# Patient Record
Sex: Male | Born: 1995 | Race: White | Marital: Single | Smoking: Former smoker
Health system: Southern US, Community
[De-identification: ages and names within clinical notes are randomized; demographics above are authoritative.]

## PROBLEM LIST (undated history)

## (undated) DIAGNOSIS — M1990 Unspecified osteoarthritis, unspecified site: Secondary | ICD-10-CM

## (undated) DIAGNOSIS — F1090 Alcohol use, unspecified, uncomplicated: Secondary | ICD-10-CM

## (undated) DIAGNOSIS — J45909 Unspecified asthma, uncomplicated: Secondary | ICD-10-CM

## (undated) DIAGNOSIS — Z7289 Other problems related to lifestyle: Secondary | ICD-10-CM

## (undated) DIAGNOSIS — K219 Gastro-esophageal reflux disease without esophagitis: Secondary | ICD-10-CM

## (undated) HISTORY — DX: Alcohol use, unspecified, uncomplicated: F10.90

## (undated) HISTORY — DX: Other problems related to lifestyle: Z72.89

## (undated) HISTORY — DX: Gastro-esophageal reflux disease without esophagitis: K21.9

---
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
## 2014-01-30 ENCOUNTER — Ambulatory Visit: Payer: Self-pay | Admitting: Physician Assistant

## 2014-01-30 LAB — GC/CHLAMYDIA PROBE AMP

## 2014-10-26 ENCOUNTER — Emergency Department: Payer: 59

## 2014-10-26 ENCOUNTER — Encounter: Payer: Self-pay | Admitting: Emergency Medicine

## 2014-10-26 ENCOUNTER — Emergency Department
Admission: EM | Admit: 2014-10-26 | Discharge: 2014-10-26 | Disposition: A | Discharge status: Home / Self Care | Payer: 59 | Attending: Emergency Medicine | Admitting: Emergency Medicine

## 2014-10-26 DIAGNOSIS — R112 Nausea with vomiting, unspecified: Secondary | ICD-10-CM | POA: Insufficient documentation

## 2014-10-26 DIAGNOSIS — R52 Pain, unspecified: Secondary | ICD-10-CM

## 2014-10-26 DIAGNOSIS — R1013 Epigastric pain: Secondary | ICD-10-CM | POA: Diagnosis not present

## 2014-10-26 LAB — COMPREHENSIVE METABOLIC PANEL
ALBUMIN: 5.2 g/dL — AB (ref 3.5–5.0)
ALT: 32 U/L (ref 17–63)
AST: 37 U/L (ref 15–41)
Alkaline Phosphatase: 102 U/L (ref 38–126)
Anion gap: 14 (ref 5–15)
BILIRUBIN TOTAL: 1.5 mg/dL — AB (ref 0.3–1.2)
BUN: 19 mg/dL (ref 6–20)
CO2: 23 mmol/L (ref 22–32)
CREATININE: 1.1 mg/dL (ref 0.61–1.24)
Calcium: 10.4 mg/dL — ABNORMAL HIGH (ref 8.9–10.3)
Chloride: 98 mmol/L — ABNORMAL LOW (ref 101–111)
GFR calc Af Amer: >60 mL/min (ref >60)
GLUCOSE: 136 mg/dL — AB (ref 65–99)
Potassium: 3.4 mmol/L — ABNORMAL LOW (ref 3.5–5.1)
Sodium: 135 mmol/L (ref 135–145)
TOTAL PROTEIN: 8.6 g/dL — AB (ref 6.5–8.1)

## 2014-10-26 LAB — CBC WITH DIFFERENTIAL/PLATELET
Basophils Absolute: 0.1 10*3/uL (ref 0–0.1)
Basophils Relative: 0 %
EOS ABS: 0.1 10*3/uL (ref 0–0.7)
Eosinophils Relative: 0 %
HCT: 54.9 % — ABNORMAL HIGH (ref 40.0–52.0)
Hemoglobin: 18.7 g/dL — ABNORMAL HIGH (ref 13.0–18.0)
Lymphocytes Relative: 9 %
Lymphs Abs: 1.9 10*3/uL (ref 1.0–3.6)
MCH: 28.9 pg (ref 26.0–34.0)
MCHC: 34 g/dL (ref 32.0–36.0)
MCV: 84.8 fL (ref 80.0–100.0)
MONO ABS: 1.8 10*3/uL — AB (ref 0.2–1.0)
Monocytes Relative: 9 %
Neutro Abs: 16.9 10*3/uL — ABNORMAL HIGH (ref 1.4–6.5)
Neutrophils Relative %: 82 %
Platelets: 248 10*3/uL (ref 150–440)
RBC: 6.47 MIL/uL — AB (ref 4.40–5.90)
RDW: 13 % (ref 11.5–14.5)
WBC: 20.8 10*3/uL — AB (ref 3.8–10.6)

## 2014-10-26 LAB — LIPASE, BLOOD: LIPASE: 40 U/L (ref 22–51)

## 2014-10-26 MED ORDER — PROMETHAZINE HCL 12.5 MG PO TABS: 12.5000 mg | ORAL_TABLET | Freq: Four times a day (QID) | ORAL | Status: DC | PRN

## 2014-10-26 MED ORDER — SODIUM CHLORIDE 0.9 % IV BOLUS (SEPSIS)
1000.0000 mL | Freq: Once | INTRAVENOUS | Status: AC
  Administered 2014-10-26: 1000 mL via INTRAVENOUS

## 2014-10-26 MED ORDER — PROMETHAZINE HCL 25 MG/ML IJ SOLN
25.0000 mg | Freq: Once | INTRAMUSCULAR | Status: AC
  Administered 2014-10-26: 25 mg via INTRAVENOUS
  Filled 2014-10-26: qty 1

## 2014-10-26 NOTE — Discharge Instructions (Signed)

## 2014-10-26 NOTE — ED Notes (Signed)
Vomiting x 2 days, states unable to sleep

## 2014-10-26 NOTE — ED Notes (Addendum)
Phenergan admin complete; IV site without redness/swelling/pain; IVFs infusing without difficulty; pt st good relief of nausea and decreased pain

## 2014-10-26 NOTE — ED Notes (Signed)
Pt uprite on stretcher in exam room with no distress noted; pt reports N/V since Monday pm with mid abd pain

## 2014-10-26 NOTE — ED Provider Notes (Addendum)
-----------------------------------------   7:38 AM on 10/26/2014 -----------------------------------------  Care was assumed from Dr. Lenard Lance at 7 AM. Briefly this is a 19 year old male who presents to the emergency permit with vomiting and epigastric pain 2 days. Labs notable for leukocytosis, hemoconcentration and mild hypercalcemia likely secondary to dehydration. Normal lipaseIV fluids infusing. He also has elevated T bili and mild continued epigastric pain which could be secondary to vomiting however will obtain right upper quadrant ultrasound to evaluate for any acute pathology.  ----------------------------------------- 9:10 AM on 10/26/2014 ----------------------------------------- Right upper quadrant ultrasound shows no acute abnormality. Small polyp was noted in the gallbladder but otherwise the gallbladder is normal appearance. Patient was significant symptomatic improvement after fluids and Phenergan. Likely viral syndrome.Tolerating by mouth intake. We'll DC with return precautions, PCP follow-up. He is comfortable with the discharge plan.   Gayla Doss, MD 10/26/14 [id number]  Gayla Doss, MD 10/26/14 [phone]

## 2014-10-26 NOTE — ED Provider Notes (Signed)
[facility] Emergency Department Provider Note  Time seen: 6:27 AM  I have reviewed the triage vital signs and the nursing notes.   HISTORY  Chief Complaint Emesis    HPI Ryan Booth is a 19 y.o. male with a past medical history of asthma presents the emergency department nausea and vomiting 2 days. According to the patient he's been very nauseated with vomiting and some upper abdominal pain which she thinks is due to the vomiting for the past 2 days. Denies any fever, diarrhea. Denies any black or bloody stool or vomit. Denies dysuria. States he was trying to go to work today but felt nauseated so he came to the emergency department for evaluation.     No past medical history on file.  There are no active problems to display for this patient.   No past surgical history on file.  No current outpatient prescriptions on file.  Allergies Review of patient's allergies indicates no known allergies.  No family history on file.  Social History Social History  Substance Use Topics  . Smoking status: Not on file  . Smokeless tobacco: Not on file  . Alcohol Use: Not on file    Review of Systems Constitutional: Negative for fever. Cardiovascular: Negative for chest pain. Respiratory: Negative for shortness of breath. Gastrointestinal: Positive for upper abdominal pain worse when vomiting. Positive for nausea and vomiting. Negative diarrhea. Genitourinary: Negative for dysuria. 10-point ROS otherwise negative.  ____________________________________________   PHYSICAL EXAM:  VITAL SIGNS: ED Triage Vitals  Enc Vitals Group     BP 10/26/14 0615 131/104 mmHg     Pulse Rate 10/26/14 0615 94     Resp 10/26/14 0615 18     Temp 10/26/14 0615 97.5 F (36.4 C)     Temp Source 10/26/14 0615 Oral     SpO2 10/26/14 0615 98 %     Weight 10/26/14 0615 173 lb (78.472 kg)     Height 10/26/14 0615  (1.803 m)     Head Cir --      Peak Flow --       Pain Score 10/26/14 0617 5     Pain Loc --      Pain Edu? --      Excl. in GC? --     Constitutional: Alert and oriented. Well appearing and in no distress. Eyes: Normal exam ENT   Mouth/Throat: Dry mucous membranes Cardiovascular: Normal rate, regular rhythm. No murmur Respiratory: Normal respiratory effort without tachypnea nor retractions. Breath sounds are clear and equal bilaterally. No wheezes/rales/rhonchi. Gastrointestinal: Soft, mild epigastric tenderness palpation. No rebound or guarding. No distention. Musculoskeletal: Nontender with normal range of motion in all extremities. Neurologic:  Normal speech and language. No gross focal neurologic deficits  Skin:  Skin is warm, dry and intact.  Psychiatric: Mood and affect are normal. Speech and behavior are normal. ____________________________________________    INITIAL IMPRESSION / ASSESSMENT AND PLAN / ED COURSE  Pertinent labs & imaging results that were available during my care of the patient were reviewed by me and considered in my medical decision making (see chart for details).  Patient with nausea and vomiting 2 days. Patient has a mild epigastric tenderness palpation on exam. We'll check labs, IV hydrate, and treat nausea. Overall patient appears well. Vital signs within normal limits.   ----------------------------------------- 6:55 AM on 10/26/2014 -----------------------------------------  Labs currently pending, patient care signed out to Dr. Inocencio Homes. ____________________________________________   FINAL CLINICAL IMPRESSION(S) / ED DIAGNOSES  Nausea and vomiting   Minna Antis, MD 10/26/14 [id number]

## 2015-04-06 ENCOUNTER — Encounter: Payer: Self-pay | Admitting: *Deleted

## 2015-04-06 ENCOUNTER — Ambulatory Visit
Admission: EM | Admit: 2015-04-06 | Discharge: 2015-04-06 | Disposition: A | Discharge status: Home / Self Care | Payer: 59 | Attending: Family Medicine | Admitting: Family Medicine

## 2015-04-06 DIAGNOSIS — R112 Nausea with vomiting, unspecified: Secondary | ICD-10-CM

## 2015-04-06 DIAGNOSIS — R110 Nausea: Secondary | ICD-10-CM

## 2015-04-06 HISTORY — DX: Unspecified osteoarthritis, unspecified site: M19.90

## 2015-04-06 MED ORDER — ONDANSETRON 8 MG PO TBDP: 8.0000 mg | ORAL_TABLET | Freq: Two times a day (BID) | ORAL | Status: DC

## 2015-04-06 MED ORDER — PROMETHAZINE HCL 25 MG/ML IJ SOLN
25.0000 mg | Freq: Once | INTRAMUSCULAR | Status: AC
  Administered 2015-04-06: 25 mg via INTRAMUSCULAR

## 2015-04-06 NOTE — Discharge Instructions (Signed)

## 2015-04-06 NOTE — ED Provider Notes (Signed)
CSN: [id number]     Arrival date & time 04/06/15  1226 History   First MD Initiated Contact with Patient 04/06/15 1322     Chief Complaint  Patient presents with  . Nausea  . Emesis   (Consider location/radiation/quality/duration/timing/severity/associated sxs/prior Treatment) HPI   20 year old male who presents with sudden of nausea and vomiting this morning that has been intractable. There is no coffee ground materials in the vomitus and no identifiable food but is mostly yellow in color. He has no diarrhea and had a normal BM this morning. Denies  stomach pain except for some tenderness around his ribs likely from the retching. He states that he ate [employer] chicken for lunch yesterday and had a hamburger with family members none of whom got sick. He is afebrile. He is accompanied by his mother who is an Nutritional therapist.  Past Medical History  Diagnosis Date  . Arthritis    History reviewed. No pertinent past surgical history. History reviewed. No pertinent family history. Social History  Substance Use Topics  . Smoking status: Current Every Day Smoker  . Smokeless tobacco: Never Used  . Alcohol Use: Yes    Review of Systems  Constitutional: Positive for appetite change.  Gastrointestinal: Positive for nausea, vomiting and abdominal pain. Negative for diarrhea, constipation and abdominal distention.  All other systems reviewed and are negative.   Allergies  Review of patient's allergies indicates no known allergies.  Home Medications   Prior to Admission medications   Medication Sig Start Date End Date Taking? Authorizing Provider  albuterol (PROVENTIL HFA;VENTOLIN HFA) 108 (90 Base) MCG/ACT inhaler Inhale 2 puffs into the lungs every 4 (four) hours as needed for wheezing or shortness of breath.   Yes Historical Provider, MD  ondansetron (ZOFRAN ODT) 8 MG disintegrating tablet Take 1 tablet (8 mg total) by mouth 2 (two) times daily. 04/06/15   Lutricia Feil, PA-C   Meds  Ordered and Administered this Visit   Medications  promethazine (PHENERGAN) injection 25 mg (25 mg Intramuscular Given 04/06/15 1322)    BP 101/87 mmHg  Pulse 83  Temp(Src) 97.5 F (36.4 C) (Oral)  Resp 22  Ht  (1.803 m)  Wt 175 lb (79.379 kg)  BMI 24.42 kg/m2  SpO2 94% No data found.   Physical Exam  Constitutional: He is oriented to person, place, and time. He appears well-developed and well-nourished. No distress.  HENT:  Head: Normocephalic and atraumatic.  Eyes: Conjunctivae are normal. Pupils are equal, round, and reactive to light.  Neck: Normal range of motion. Neck supple.  Cardiovascular: Normal rate, regular rhythm and normal heart sounds.  Exam reveals no gallop.   No murmur heard. Pulmonary/Chest: Effort normal and breath sounds normal. No respiratory distress. He has no wheezes. He has no rales.  Abdominal: Soft. He exhibits no distension and no mass. There is tenderness. There is no rebound and no guarding.  Musculoskeletal: Normal range of motion. He exhibits no edema or tenderness.  Lymphadenopathy:    He has no cervical adenopathy.  Neurological: He is alert and oriented to person, place, and time.  Skin: Skin is warm and dry. He is not diaphoretic.  Psychiatric: He has a normal mood and affect. His behavior is normal. Judgment and thought content normal.  Nursing note and vitals reviewed.   ED Course  Procedures (including critical care time)  Labs Review Labs Reviewed - No data to display  Imaging Review No results found.   Visual Acuity Review  Right  Eye Distance:   Left Eye Distance:   Bilateral Distance:    Right Eye Near:   Left Eye Near:    Bilateral Near:         MDM   1. Nausea and vomiting in adult patient    Discharge Medication List as of 04/06/2015  2:13 PM    START taking these medications   Details  ondansetron (ZOFRAN ODT) 8 MG disintegrating tablet Take 1 tablet (8 mg total) by mouth 2 (two) times daily.,  Starting 04/06/2015, Until Discontinued, Normal      Plan: 1.Diagnosis reviewed with patient 2. rx as per orders; risks, benefits, potential side effects reviewed with patient 3. Recommend supportive treatment with fluids as tolerated and advance his diet as tolerated. Given medication to try and control his nausea so that he may start taking fluids. I told his mother to be on look out for increasing symptoms particularly belly pain fever chills or diarrhea. If he is not improving and she should take him to the emergency department for further workup and evaluation. At the present time he seems stable and actually looks improved since when he presented to our facility. His mother who is an ER nurse  stated that she would watch him closely and take him to the ER if there is any change 4. F/u prn if symptoms worsen or don't improve     Lutricia Feil, PA-C 04/06/15 1440

## 2015-04-06 NOTE — ED Notes (Signed)
Patient started, with immediate onset, nausea and vomiting this AM. Patient is still actively vomiting.

## 2015-06-11 ENCOUNTER — Ambulatory Visit
Admission: EM | Admit: 2015-06-11 | Discharge: 2015-06-11 | Disposition: A | Discharge status: Home / Self Care | Payer: 59 | Attending: Family Medicine | Admitting: Family Medicine

## 2015-06-11 ENCOUNTER — Encounter: Payer: Self-pay | Admitting: *Deleted

## 2015-06-11 DIAGNOSIS — H6503 Acute serous otitis media, bilateral: Secondary | ICD-10-CM | POA: Diagnosis not present

## 2015-06-11 HISTORY — DX: Unspecified asthma, uncomplicated: J45.909

## 2015-06-11 MED ORDER — AMOXICILLIN 875 MG PO TABS: 875.0000 mg | ORAL_TABLET | Freq: Two times a day (BID) | ORAL | Status: DC

## 2015-06-11 NOTE — ED Notes (Signed)
Bilat ear pain x3 days. Recent URI. Denies drainage.

## 2015-06-11 NOTE — ED Provider Notes (Signed)
CSN: [id number]     Arrival date & time 06/11/15  7829 History   First MD Initiated Contact with Patient 06/11/15 0913     Chief Complaint  Patient presents with  . Otalgia   (Consider location/radiation/quality/duration/timing/severity/associated sxs/prior Treatment) Patient is a 20 y.o. male presenting with ear pain. The history is provided by the patient.  Otalgia Location:  Bilateral Behind ear:  No abnormality Quality:  Aching Severity:  Moderate Onset quality:  Sudden Duration:  3 days Timing:  Constant Progression:  Worsening Chronicity:  New Associated symptoms: congestion and rhinorrhea   Associated symptoms: no ear discharge, no rash, no tinnitus and no vomiting     Past Medical History  Diagnosis Date  . Asthma    History reviewed. No pertinent past surgical history. History reviewed. No pertinent family history. Social History  Substance Use Topics  . Smoking status: Current Every Day Smoker -- 1.00 packs/day    Types: Cigarettes  . Smokeless tobacco: None  . Alcohol Use: Yes     Comment: occasional    Review of Systems  HENT: Positive for congestion, ear pain and rhinorrhea. Negative for ear discharge and tinnitus.   Gastrointestinal: Negative for vomiting.  Skin: Negative for rash.    Allergies  Review of patient's allergies indicates no known allergies.  Home Medications   Prior to Admission medications   Medication Sig Start Date End Date Taking? Authorizing Provider  amoxicillin (AMOXIL) 875 MG tablet Take 1 tablet (875 mg total) by mouth 2 (two) times daily. 06/11/15   Payton Mccallum, MD  promethazine (PHENERGAN) 12.5 MG tablet Take 1 tablet (12.5 mg total) by mouth every 6 (six) hours as needed for nausea or vomiting. 10/26/14   Minna Antis, MD   Meds Ordered and Administered this Visit  Medications - No data to display  BP 111/69 mmHg  Pulse 57  Temp(Src) 97.5 F (36.4 C) (Oral)  Resp 16  Ht  (1.803 m)  Wt 175 lb (79.379 kg)   BMI 24.42 kg/m2  SpO2 100% No data found.   Physical Exam  Constitutional: He appears well-developed and well-nourished. No distress.  HENT:  Head: Normocephalic and atraumatic.  Right Ear: External ear and ear canal normal. Tympanic membrane is erythematous and bulging. A middle ear effusion is present.  Left Ear: External ear and ear canal normal. Tympanic membrane is erythematous and bulging. A middle ear effusion is present.  Nose: Rhinorrhea present.  Mouth/Throat: Uvula is midline, oropharynx is clear and moist and mucous membranes are normal. No oropharyngeal exudate or tonsillar abscesses.  Neck: Normal range of motion. Neck supple. No tracheal deviation present. No thyromegaly present.  Cardiovascular: Normal rate.   Pulmonary/Chest: Effort normal. No stridor. No respiratory distress.  Lymphadenopathy:    He has no cervical adenopathy.  Neurological: He is alert.  Skin: Skin is warm and dry. No rash noted. He is not diaphoretic.  Nursing note and vitals reviewed.   ED Course  Procedures (including critical care time)  Labs Review Labs Reviewed - No data to display  Imaging Review No results found.   Visual Acuity Review  Right Eye Distance:   Left Eye Distance:   Bilateral Distance:    Right Eye Near:   Left Eye Near:    Bilateral Near:         MDM   1. Bilateral acute serous otitis media, recurrence not specified    Discharge Medication List as of 06/11/2015  9:36 AM    START  taking these medications   Details  amoxicillin (AMOXIL) 875 MG tablet Take 1 tablet (875 mg total) by mouth 2 (two) times daily., Starting 06/11/2015, Until Discontinued, Normal       1. diagnosis reviewed with patient 2. rx as per orders above; reviewed possible side effects, interactions, risks and benefits  3. Recommend supportive treatment with otc analgesics prn 4. Follow-up prn if symptoms worsen or don't improve    Payton Mccallumrlando Wes Lezotte, MD 06/11/15 [phone]

## 2015-07-05 ENCOUNTER — Ambulatory Visit (INDEPENDENT_AMBULATORY_CARE_PROVIDER_SITE_OTHER): Payer: 59 | Admitting: Family Medicine

## 2015-07-05 ENCOUNTER — Encounter: Payer: Self-pay | Admitting: Family Medicine

## 2015-07-05 VITALS — BP 124/88 | HR 98 | Temp 98.3°F | Ht 71.5 in | Wt 176.4 lb

## 2015-07-05 DIAGNOSIS — H66005 Acute suppurative otitis media without spontaneous rupture of ear drum, recurrent, left ear: Secondary | ICD-10-CM | POA: Diagnosis not present

## 2015-07-05 DIAGNOSIS — F418 Other specified anxiety disorders: Secondary | ICD-10-CM | POA: Diagnosis not present

## 2015-07-05 DIAGNOSIS — F329 Major depressive disorder, single episode, unspecified: Secondary | ICD-10-CM | POA: Insufficient documentation

## 2015-07-05 DIAGNOSIS — F419 Anxiety disorder, unspecified: Secondary | ICD-10-CM

## 2015-07-05 DIAGNOSIS — J4520 Mild intermittent asthma, uncomplicated: Secondary | ICD-10-CM | POA: Insufficient documentation

## 2015-07-05 DIAGNOSIS — H6690 Otitis media, unspecified, unspecified ear: Secondary | ICD-10-CM | POA: Insufficient documentation

## 2015-07-05 MED ORDER — AMOXICILLIN-POT CLAVULANATE 875-125 MG PO TABS: 1.0000 | ORAL_TABLET | Freq: Two times a day (BID) | ORAL | Status: DC

## 2015-07-05 MED ORDER — SERTRALINE HCL 50 MG PO TABS: ORAL_TABLET | ORAL | Status: DC

## 2015-07-05 MED ORDER — ALBUTEROL SULFATE HFA 108 (90 BASE) MCG/ACT IN AERS: 2.0000 | INHALATION_SPRAY | Freq: Four times a day (QID) | RESPIRATORY_TRACT | Status: DC | PRN

## 2015-07-05 NOTE — Patient Instructions (Signed)
Nice to meet you. We are going to start you on Zoloft for your depression and anxiety. We're going to treat you with Augmentin for your left ear infection. I have refilled your albuterol as well. If you develop shortness of breath, fevers, thoughts of harming herself or others, or any new or change in symptoms please seek medical attention.

## 2015-07-05 NOTE — Progress Notes (Signed)
Patient ID: Ryan Booth, male   DOB: 09/12/1995, 20 y.o.   MRN: [id number]  Marikay AlarEric Sonnenberg, MD Phone: [phone]  Ryan Booth is a 20 y.o. male who presents today for new patient visit.  Asthma: Patient notes history of asthma. Rarely uses his albuterol. Last used a couple weeks ago. At most he uses it once every couple of weeks. Occasionally does get a cough at night though he has been sick recently. Previously was on Symbicort though not in many years.  Left ear infection: Patient notes he had otitis media earlier this month. He was treated with amoxicillin though notes he drank alcohol over his 20st birthday and thinks that may have interfered with the antibiotic as he continues to have left ear pain. No fevers. He does still note some sinus congestion and sore throat with cough productive of mucus. Has not been on antibiotics since 06/21/15.  Depression and anxiety: Patient notes this started in January relating to him being fired from his job for being accused of having drugs on a [employer]. He had to go to court for this. He has not been able to find steady work since then. No SI. He has not ever been on medications for depression. He does note some anger issues though no issues with thoughts of harming anyone else.  Active Ambulatory Problems    Diagnosis Date Noted  . Otitis media 07/05/2015  . Mild intermittent asthma 07/05/2015  . Anxiety and depression 07/05/2015   Resolved Ambulatory Problems    Diagnosis Date Noted  . No Resolved Ambulatory Problems   Past Medical History  Diagnosis Date  . Arthritis   . Asthma   . Alcohol problem drinking   . GERD (gastroesophageal reflux disease)     Family History  Problem Relation Age of Onset  . Drug abuse      Parent, grandparent  . Arthritis    . Heart disease      Grandparent  . Sudden death      Grandparent    Social History   Social History  . Marital Status: Single    Spouse Name: N/A  . Number of  Children: N/A  . Years of Education: N/A   Occupational History  . Not on file.   Social History Main Topics  . Smoking status: Current Every Day Smoker  . Smokeless tobacco: Never Used  . Alcohol Use: 7.2 oz/week    12 Standard drinks or equivalent per week  . Drug Use: Yes    Special: Marijuana  . Sexual Activity: Not on file   Other Topics Concern  . Not on file   Social History Narrative    ROS  General:  Negative for nexplained weight loss, fever Skin: Negative for new or changing mole, sore that won't heal HEENT: Negative for trouble hearing, trouble seeing, ringing in ears, mouth sores, hoarseness, change in voice, dysphagia. CV:  Negative for chest pain, dyspnea, edema, palpitations Resp: Positive for cough, negative for dyspnea, hemoptysis GI: Negative for nausea, vomiting, diarrhea, constipation, abdominal pain, melena, hematochezia. GU: Negative for dysuria, incontinence, urinary hesitance, hematuria, vaginal or penile discharge, polyuria, sexual difficulty, lumps in testicle or breasts MSK: Negative for muscle cramps or aches, joint pain or swelling Neuro: Negative for headaches, weakness, numbness, dizziness, passing out/fainting Psych: Positive for depression, anxiety, negative for memory problems  Objective  Physical Exam Filed Vitals:   07/05/15 1516  BP: 124/88  Pulse: 98  Temp: 98.3 F (36.8 C)  BP Readings from Last 3 Encounters:  07/05/15 124/88  04/06/15 101/87   Wt Readings from Last 3 Encounters:  07/05/15 176 lb 6.4 oz (80.015 kg)  04/06/15 175 lb (79.379 kg)    Physical Exam  Constitutional: He is well-developed, well-nourished, and in no distress.  HENT:  Head: Normocephalic and atraumatic.  Right Ear: External ear normal.  Left Ear: External ear normal.  Mouth/Throat: Oropharynx is clear and moist.  1+ tonsils, left TM erythematous with purulent material behind the TM right TM normal  Eyes: Conjunctivae are normal. Pupils are  equal, round, and reactive to light.  Neck: Neck supple.  Cardiovascular: Normal rate, regular rhythm and normal heart sounds.   Pulmonary/Chest: Effort normal and breath sounds normal.  Abdominal: Soft. Bowel sounds are normal. He exhibits no distension. There is no tenderness. There is no rebound and no guarding.  Musculoskeletal: He exhibits no edema.  Lymphadenopathy:    He has no cervical adenopathy.  Neurological: He is alert. Gait normal.  Skin: Skin is warm and dry. He is not diaphoretic.  Psychiatric: Affect normal.  Mood depressed, anxious     Assessment/Plan:   Otitis media Patient with inadequately treated otitis media. Previously on amoxicillin. We will treat with Augmentin to provide broader coverage. Given return precautions.  Mild intermittent asthma Asymptomatic at this time. Rarely uses albuterol. We will refill albuterol. We'll continue to monitor. He is given return precautions.  Anxiety and depression Moderate depression and anxiety based on exam and PHQ 9. No SI or HI. Discussed treatment options. Patient declined therapy. Start on Zoloft. Discussed possibility of increase in suicidal thoughts in his age group and he accepted this risk. He will monitor and if this occurs he will let us know and seek medical attention. Given return precautions.    No orders of the defined types were placed in this encounter.    Meds ordered this encounter  Medications  . sertraline (ZOLOFT) 50 MG tablet    Sig: Please take 25 mg (1/2 tablet) daily by mouth for 7 days, then take 50 mg (1 tablet) daily by mouth    Dispense:  30 tablet    Refill:  3  . amoxicillin-clavulanate (AUGMENTIN) 875-125 MG tablet    Sig: Take 1 tablet by mouth 2 (two) times daily.    Dispense:  20 tablet    Refill:  0  . albuterol (PROVENTIL HFA;VENTOLIN HFA) 108 (90 Base) MCG/ACT inhaler    Sig: Inhale 2 puffs into the lungs every 6 (six) hours as needed for wheezing or shortness of breath.     Dispense:  1 Inhaler    Refill:  1     [name], MD [facility]

## 2015-07-05 NOTE — Assessment & Plan Note (Signed)
Patient with inadequately treated otitis media. Previously on amoxicillin. We will treat with Augmentin to provide broader coverage. Given return precautions.

## 2015-07-05 NOTE — Assessment & Plan Note (Signed)
Asymptomatic at this time. Rarely uses albuterol. We will refill albuterol. We'll continue to monitor. He is given return precautions.

## 2015-07-05 NOTE — Progress Notes (Signed)
Pre visit review using our clinic review tool, if applicable. No additional management support is needed unless otherwise documented below in the visit note. 

## 2015-07-05 NOTE — Assessment & Plan Note (Signed)
Moderate depression and anxiety based on exam and PHQ 9. No SI or HI. Discussed treatment options. Patient declined therapy. Start on Zoloft. Discussed possibility of increase in suicidal thoughts in his age group and he accepted this risk. He will monitor and if this occurs he will let us know and seek medical attention. Given return precautions.

## 2015-09-27 ENCOUNTER — Encounter: Payer: Self-pay | Admitting: Family Medicine

## 2016-02-02 ENCOUNTER — Encounter: Payer: Self-pay | Admitting: Emergency Medicine

## 2016-02-02 ENCOUNTER — Ambulatory Visit
Admission: EM | Admit: 2016-02-02 | Discharge: 2016-02-02 | Disposition: A | Discharge status: Home / Self Care | Payer: 59 | Attending: Family Medicine | Admitting: Family Medicine

## 2016-02-02 DIAGNOSIS — B86 Scabies: Secondary | ICD-10-CM

## 2016-02-02 MED ORDER — PERMETHRIN 5 % EX CREA: TOPICAL_CREAM | CUTANEOUS | 0 refills | Status: DC

## 2016-02-02 NOTE — ED Provider Notes (Signed)
[facility]    CSN: [id number] Arrival date & time: 02/02/16  0803  History   Chief Complaint Chief Complaint  Patient presents with  . Otalgia   HPI 20 year old male presents with complaints of rash. He reported that he had complaints of an ear infection as he was embarrassed about his complaint.  Patient reports that he's had a rash approximately 2 weeks. It is located in his axilla, chest, genital area, in between the fingers. He reports that it is quite pruritic. No known exacerbating or relieving factors. No other associated symptoms. No new exposures or changes that he is aware of. No other complaints at this time.  Past Medical History:  Diagnosis Date  . Alcohol problem drinking   . Arthritis   . Asthma   . GERD (gastroesophageal reflux disease)    Patient Active Problem List   Diagnosis Date Noted  . Otitis media 07/05/2015  . Mild intermittent asthma 07/05/2015  . Anxiety and depression 07/05/2015   History reviewed. No pertinent surgical history.  Home Medications    Prior to Admission medications   Medication Sig Start Date End Date Taking? Authorizing Provider  albuterol (PROVENTIL HFA;VENTOLIN HFA) 108 (90 Base) MCG/ACT inhaler Inhale 2 puffs into the lungs every 6 (six) hours as needed for wheezing or shortness of breath. 07/05/15   Glori LuisEric G Sonnenberg, MD  permethrin (ELIMITE) 5 % cream Thoroughly massage cream from head to soles of feet; leave on for 8 to 14 hours before removing. Repeat in 1 week. 02/02/16   Tommie SamsJayce G Clinton Wahlberg, DO   Family History Family History  Problem Relation Age of Onset  . Drug abuse      Parent, grandparent  . Arthritis    . Heart disease      Grandparent  . Sudden death      Grandparent   Social History Social History  Substance Use Topics  . Smoking status: Current Every Day Smoker    Packs/day: 1.00    Types: Cigarettes  . Smokeless tobacco: Never Used  . Alcohol use 7.2 oz/week    12 Standard drinks or  equivalent per week     Comment: occasional   Allergies   Patient has no known allergies.  Review of Systems Review of Systems  Constitutional: Negative.   Skin: Positive for rash.   Physical Exam Triage Vital Signs ED Triage Vitals  Enc Vitals Group     BP 02/02/16 0836 (!) 148/98     Pulse Rate 02/02/16 0836 73     Resp 02/02/16 0836 16     Temp 02/02/16 0836 97.5 F (36.4 C)     Temp Source 02/02/16 0836 Oral     SpO2 02/02/16 0836 100 %     Weight 02/02/16 0834 180 lb (81.6 kg)     Height 02/02/16 0834 5\' 11"  (1.803 m)     Head Circumference --      Peak Flow --      Pain Score 02/02/16 0835 0     Pain Loc --      Pain Edu? --      Excl. in GC? --    Updated Vital Signs BP (!) 148/98 (BP Location: Right Arm)   Pulse 73   Temp 97.5 F (36.4 C) (Oral)   Resp 16   Ht 5\' 11"  (1.803 m)   Wt 180 lb (81.6 kg)   SpO2 100%   BMI 25.10 kg/m   Physical Exam  Constitutional: He appears  well-developed. No distress.  HENT:  Head: Normocephalic and atraumatic.  Pulmonary/Chest: Effort normal.  Psychiatric: He has a normal mood and affect.     UC Treatments / Results  Labs (all labs ordered are listed, but only abnormal results are displayed) Labs Reviewed - No data to display  EKG  EKG Interpretation None       Radiology No results found.  Procedures Procedures (including critical care time)  Medications Ordered in UC Medications - No data to display   Initial Impression / Assessment and Plan / UC Course  I have reviewed the triage vital signs and the nursing notes.  Pertinent labs & imaging results that were available during my care of the patient were reviewed by me and considered in my medical decision making (see chart for details).  Clinical Course   20 year old male presents with scabies. Distribution consistent with this. Treating with permethrin.  Final Clinical Impressions(s) / UC Diagnoses   Final diagnoses:  Scabies   New  Prescriptions New Prescriptions   PERMETHRIN (ELIMITE) 5 % CREAM    Thoroughly massage cream from head to soles of feet; leave on for 8 to 14 hours before removing. Repeat in 1 week.     Tommie SamsJayce G Wentworth Edelen, DO 02/02/16 [id number]

## 2016-02-02 NOTE — ED Triage Notes (Signed)
Patient c/o bumps on his penis for the past couple of days.  Patient reports having unprotected sex.  Patient c/o pain in his left ear for couple of weeks.

## 2016-02-02 NOTE — Discharge Instructions (Signed)
Use the medication as prescribed. ° °Take care ° °Dr. Orion Mole  °

## 2016-04-13 ENCOUNTER — Ambulatory Visit
Admission: EM | Admit: 2016-04-13 | Discharge: 2016-04-13 | Disposition: A | Discharge status: Home / Self Care | Payer: 59 | Attending: Emergency Medicine | Admitting: Emergency Medicine

## 2016-04-13 ENCOUNTER — Encounter: Payer: Self-pay | Admitting: Emergency Medicine

## 2016-04-13 DIAGNOSIS — H6982 Other specified disorders of Eustachian tube, left ear: Secondary | ICD-10-CM | POA: Diagnosis not present

## 2016-04-13 DIAGNOSIS — R21 Rash and other nonspecific skin eruption: Secondary | ICD-10-CM | POA: Diagnosis not present

## 2016-04-13 MED ORDER — ALBUTEROL SULFATE HFA 108 (90 BASE) MCG/ACT IN AERS: 1.0000 | INHALATION_SPRAY | Freq: Four times a day (QID) | RESPIRATORY_TRACT | 1 refills | Status: AC | PRN

## 2016-04-13 MED ORDER — FLUTICASONE PROPIONATE 50 MCG/ACT NA SUSP: 2.0000 | Freq: Every day | NASAL | 0 refills | Status: DC

## 2016-04-13 NOTE — ED Triage Notes (Signed)
Patient c/o left ear pressure and fullness for several months.  Patient also reports itchy rash on his hands and arms for the past 2 weeks.

## 2016-04-13 NOTE — ED Provider Notes (Signed)
CSN: [id number]     Arrival date & time 04/13/16  3329 History   First MD Initiated Contact with Patient 04/13/16 [phone]     Chief Complaint  Patient presents with  . Otalgia  . Rash   (Consider location/radiation/quality/duration/timing/severity/associated sxs/prior Treatment) HPI  21 year old male who presents with left ear pressure and fullness for several months. States that he was treated for an ear infection one year ago but despite that has noticed this pressure and fullness. He was treated for scabies infestation on 11/25/ 2017. He followed the instructions said to lateral. He states that he's noticed a rash on his hands and arms for the last 2 weeks. His rash is very limited and is mainly on interdigitally on the right. Please refer to the picture under skin exam.       Past Medical History:  Diagnosis Date  . Alcohol problem drinking   . Arthritis   . Asthma   . GERD (gastroesophageal reflux disease)    History reviewed. No pertinent surgical history. Family History  Problem Relation Age of Onset  . Drug abuse      Parent, grandparent  . Arthritis    . Heart disease      Grandparent  . Sudden death      Grandparent   Social History  Substance Use Topics  . Smoking status: Current Every Day Smoker    Packs/day: 1.00    Types: Cigarettes  . Smokeless tobacco: Never Used  . Alcohol use 7.2 oz/week    12 Standard drinks or equivalent per week     Comment: occasional    Review of Systems  Constitutional: Negative for activity change, chills, fatigue and fever.  HENT: Positive for ear pain.   Skin: Positive for rash.  All other systems reviewed and are negative.   Allergies  Patient has no known allergies.  Home Medications   Prior to Admission medications   Medication Sig Start Date End Date Taking? Authorizing Provider  albuterol (PROVENTIL HFA;VENTOLIN HFA) 108 (90 Base) MCG/ACT inhaler Inhale 1-2 puffs into the lungs every 6 (six) hours as needed for  wheezing or shortness of breath. 04/13/16   Lutricia Feil, PA-C  fluticasone (FLONASE) 50 MCG/ACT nasal spray Place 2 sprays into both nostrils daily. 04/13/16   Lutricia Feil, PA-C   Meds Ordered and Administered this Visit  Medications - No data to display  BP 130/71 (BP Location: Left Arm)   Pulse 72   Temp 98.2 F (36.8 C) (Oral)   Resp 16   Ht 5\' 11"  (1.803 m)   Wt 175 lb (79.4 kg)   SpO2 99%   BMI 24.41 kg/m  No data found.   Physical Exam  Constitutional: He is oriented to person, place, and time. He appears well-developed and well-nourished. No distress.  HENT:  Head: Normocephalic and atraumatic.  Examination of the right ear shows normal TM and canal. Left ear shows sclerosis of the TM fluid behind the TM. Appears to be slightly bulging. No erythema present.  Eyes: EOM are normal. Pupils are equal, round, and reactive to light. Right eye exhibits no discharge. Left eye exhibits no discharge.  Neck: Normal range of motion. Neck supple.  Musculoskeletal: Normal range of motion.  Lymphadenopathy:    He has no cervical adenopathy.  Neurological: He is alert and oriented to person, place, and time.  Skin: Skin is warm and dry. He is not diaphoretic.  Psychiatric: He has a normal mood and affect. His  behavior is normal. Judgment and thought content normal.  Nursing note and vitals reviewed.     Urgent Care Course     Procedures (including critical care time)  Labs Review Labs Reviewed - No data to display  Imaging Review No results found.   Visual Acuity Review  Right Eye Distance:   Left Eye Distance:   Bilateral Distance:    Right Eye Near:   Left Eye Near:    Bilateral Near:         MDM   1. Rash   2. Eustachian tube dysfunction, left    Discharge Medication List as of 04/13/2016  9:33 AM    START taking these medications   Details  albuterol (PROVENTIL HFA;VENTOLIN HFA) 108 (90 Base) MCG/ACT inhaler Inhale 1-2 puffs into the lungs every  6 (six) hours as needed for wheezing or shortness of breath., Starting Sun 04/13/2016, Normal    fluticasone (FLONASE) 50 MCG/ACT nasal spray Place 2 sprays into both nostrils daily., Starting Sun 04/13/2016, Normal      Plan: 1. Test/x-ray results and diagnosis reviewed with patient 2. rx as per orders; risks, benefits, potential side effects reviewed with patient 3. Recommend supportive treatment with Use of Flonase for a month. he was given the name ENT that he may follow-up with in one month and is not improving. I told him that the residual rash that he has risk scabies is likely normal and is currently much diminished over before. He does not need another round of Elimite at this time. He can use TC hydrocortisone for any itching which does not seem to be a prominent feature at the present time. Continues to bother him I recommended follow-up with a dermatologist or return to our clinic. 4. F/u prn if symptoms worsen or don't improve     Lutricia FeilWilliam P Olia Hinderliter, PA-C 04/13/16 1713

## 2016-04-21 ENCOUNTER — Encounter: Payer: Self-pay | Admitting: *Deleted

## 2016-04-21 ENCOUNTER — Ambulatory Visit
Admission: EM | Admit: 2016-04-21 | Discharge: 2016-04-21 | Disposition: A | Discharge status: Home / Self Care | Payer: 59 | Attending: Emergency Medicine | Admitting: Emergency Medicine

## 2016-04-21 DIAGNOSIS — L0291 Cutaneous abscess, unspecified: Secondary | ICD-10-CM | POA: Diagnosis not present

## 2016-04-21 MED ORDER — SULFAMETHOXAZOLE-TRIMETHOPRIM 800-160 MG PO TABS: 2.0000 | ORAL_TABLET | Freq: Two times a day (BID) | ORAL | 0 refills | Status: DC

## 2016-04-21 MED ORDER — HYDROCODONE-ACETAMINOPHEN 5-325 MG PO TABS: 1.0000 | ORAL_TABLET | ORAL | 0 refills | Status: DC | PRN

## 2016-04-21 MED ORDER — IBUPROFEN 800 MG PO TABS: 800.0000 mg | ORAL_TABLET | Freq: Three times a day (TID) | ORAL | 0 refills | Status: DC

## 2016-04-21 NOTE — Discharge Instructions (Signed)
Return here or follow up with your doctor in 2 days for a wound check.  Take the medication as written. Take 1 gram of tylenol with the motrin up to 3 times a day as needed for pain and fever. This is an effective combination for pain. Take the norco only for severe pain. Do not take the tylenol and hydrocodone/norco as they both have tylenol in them and too much can hurt your liver. Do not exceed 4 g of Tylenol from all sources in one day. Each pill of Norco has 325 mg of Tylenol in it. Return to the ED if you get worse, have a headache, neck stiffness, fever >100.4, or for any concerns.

## 2016-04-21 NOTE — ED Triage Notes (Signed)
Abcess to left posterior neck x1 week.

## 2016-04-21 NOTE — ED Provider Notes (Signed)
HPI  SUBJECTIVE:  Ryan Booth is a [age] y.o. male who presents with a painful mass of gradually increasing size in his posterior left neck for the past week. States it started off as well. He denies any trauma, insect bite. States he does not shave his neck. No erythema that he knows of, drainage, fevers, bodyaches. No abscesses elsewhere. No burning or tingling pain preceding the swelling. He denies neck stiffness. He tried ibuprofen 800 mg every 4-6 hours warm compresses which has helped the symptoms. Symptoms worse with palpation and neck movement. He has had symptoms like this before, but it drained on his own. No contacts with MRSA. No antibiotics in the past month. He has a  past medical history of asthma, acne. No history of MRSA, diabetes, hypertension, artificial joints her heart valves, IVDU.Marland Kitchen. PMD: None.    Past Medical History:  Diagnosis Date  . Alcohol problem drinking   . Arthritis   . Asthma   . GERD (gastroesophageal reflux disease)     History reviewed. No pertinent surgical history.  Family History  Problem Relation Age of Onset  . Drug abuse      Parent, grandparent  . Arthritis    . Heart disease      Grandparent  . Sudden death      Grandparent    Social History  Substance Use Topics  . Smoking status: Current Every Day Smoker    Packs/day: 1.00    Types: Cigarettes  . Smokeless tobacco: Never Used  . Alcohol use 7.2 oz/week    12 Standard drinks or equivalent per week     Comment: occasional    No current facility-administered medications for this encounter.   Current Outpatient Prescriptions:  .  albuterol (PROVENTIL HFA;VENTOLIN HFA) 108 (90 Base) MCG/ACT inhaler, Inhale 1-2 puffs into the lungs every 6 (six) hours as needed for wheezing or shortness of breath., Disp: 1 Inhaler, Rfl: 1 .  fluticasone (FLONASE) 50 MCG/ACT nasal spray, Place 2 sprays into both nostrils daily., Disp: 16 g, Rfl: 0 .  HYDROcodone-acetaminophen (NORCO/VICODIN)  5-325 MG tablet, Take 1-2 tablets by mouth every 4 (four) hours as needed for moderate pain., Disp: 20 tablet, Rfl: 0 .  ibuprofen (ADVIL,MOTRIN) 800 MG tablet, Take 1 tablet (800 mg total) by mouth 3 (three) times daily., Disp: 30 tablet, Rfl: 0 .  sulfamethoxazole-trimethoprim (BACTRIM DS,SEPTRA DS) 800-160 MG tablet, Take 2 tablets by mouth 2 (two) times daily., Disp: 20 tablet, Rfl: 0  No Known Allergies   ROS  As noted in HPI.   Physical Exam  BP (!) 141/56 (BP Location: Left Arm)   Pulse (!) 58   Temp 98.2 F (36.8 C) (Oral)   Resp 16   Ht 5\' 9"  (1.753 m)   Wt 175 lb (79.4 kg)   SpO2 100%   BMI 25.84 kg/m   Constitutional: Well developed, well nourished, no acute distress Eyes:  EOMI, conjunctiva normal bilaterally HENT: Normocephalic, atraumatic,mucus membranes moist Respiratory: Normal inspiratory effort Cardiovascular: Normal rate GI: nondistended Skin: 4 x 3 cm area of tender erythema with induration and a small amount of central fluctuance with several small bumps at the base of the left occiput/upper neck. Marked area of erythema with a marker for reference.  Lymph: No cervical lymphadenopathy  Musculoskeletal: no deformities Neurologic: Alert & oriented x 3, no focal neuro deficits Psychiatric: Speech and behavior appropriate   ED Course   Medications - No data to display  Orders Placed This Encounter  Procedures  . Wound or Superficial Culture    Standing Status:   Standing    Number of Occurrences:   1    Order Specific Question:   Patient immune status    Answer:   Normal    No results found for this or any previous visit (from the past 24 hour(s)). No results found.  ED Clinical Impression  Abscess  ED Assessment/Plan  Procedure note: Cleaned the area thoroughly with chlorhexidine. Shaved hair to better expose the area. Cleaned again with chlorhexidine. Used 1 cc of lidocaine 1% plain, performed local infiltration with adequate anesthesia.  A single stab wound was made with an 11 blade with expression of moderate purulent drainage. Explored wound to break up any loculations, wound cultures were sent. It was then irrigated with 350 cc of normal saline. Patient was unable to tolerate packing., So the wound was left open. Pressure dressing applied. Patient otherwise tolerated procedure well.   Home with Bactrim, ibuprofen 800 mg of 1 g of Tylenol 3 times a day or Norco, warm compresses, return here or with his primary care physician in 2 days for wound recheck. Gave patient very strict return precautions.   Patient agrees with plan.   Meds ordered this encounter  Medications  . ibuprofen (ADVIL,MOTRIN) 800 MG tablet    Sig: Take 1 tablet (800 mg total) by mouth 3 (three) times daily.    Dispense:  30 tablet    Refill:  0  . HYDROcodone-acetaminophen (NORCO/VICODIN) 5-325 MG tablet    Sig: Take 1-2 tablets by mouth every 4 (four) hours as needed for moderate pain.    Dispense:  20 tablet    Refill:  0  . sulfamethoxazole-trimethoprim (BACTRIM DS,SEPTRA DS) 800-160 MG tablet    Sig: Take 2 tablets by mouth 2 (two) times daily.    Dispense:  20 tablet    Refill:  0    *This clinic note was created using Scientist, clinical (histocompatibility and immunogenetics). Therefore, there may be occasional mistakes despite careful proofreading.  ?   Domenick Gong, MD 04/21/16 1731

## 2016-04-23 LAB — AEROBIC CULTURE W GRAM STAIN (SUPERFICIAL SPECIMEN): Special Requests: NORMAL

## 2016-04-23 LAB — AEROBIC CULTURE  (SUPERFICIAL SPECIMEN)

## 2016-07-07 ENCOUNTER — Ambulatory Visit
Admission: EM | Admit: 2016-07-07 | Discharge: 2016-07-07 | Disposition: A | Discharge status: Home / Self Care | Payer: 59 | Attending: Family Medicine | Admitting: Family Medicine

## 2016-07-07 ENCOUNTER — Encounter: Payer: Self-pay | Admitting: Emergency Medicine

## 2016-07-07 DIAGNOSIS — J45909 Unspecified asthma, uncomplicated: Secondary | ICD-10-CM

## 2016-07-07 DIAGNOSIS — Z760 Encounter for issue of repeat prescription: Secondary | ICD-10-CM

## 2016-07-07 MED ORDER — ALBUTEROL SULFATE HFA 108 (90 BASE) MCG/ACT IN AERS: 2.0000 | INHALATION_SPRAY | RESPIRATORY_TRACT | 0 refills | Status: DC | PRN

## 2016-07-07 NOTE — ED Triage Notes (Signed)
Patient states that he is here for a refill on his albuterol inhaler for his asthma.

## 2016-07-07 NOTE — ED Provider Notes (Signed)
[facility] ____________________________________________  Time seen: Approximately 1:01 PM  I have reviewed the triage vital signs and the nursing notes.   HISTORY  Chief Complaint Medication Refill   HPI Ryan Booth is a 21 y.o. male presents for request of prescription refill of home albuterol inhaler. Patient reports he has chronic seasonal allergies as well as asthma. Patient states that this time of year he does have occasional wheezing and states that it bothers him more so at night, consistent with his seasonal allergies. Denies any current wheezing. Denies apnea sensation or shortness of breath at night or daily. Patient states it is consistent with his chronic pattern, but states that he does not currently have a primary care physician to obtain refills of albuterol inhaler. States wanted to have inhaler in case of need. Patient reports that he is in the process of establishing with a primary care. Denies any sore throat, nasal congestion, cough or other complaints at this time. Patient reports otherwise feels well.Denies recent sickness. Denies recent antibiotic use.    Past Medical History:  Diagnosis Date  . Alcohol problem drinking   . Arthritis   . Asthma   . GERD (gastroesophageal reflux disease)     Patient Active Problem List   Diagnosis Date Noted  . Otitis media 07/05/2015  . Mild intermittent asthma 07/05/2015  . Anxiety and depression 07/05/2015    History reviewed. No pertinent surgical history.   No current facility-administered medications for this encounter.   Current Outpatient Prescriptions:  .  albuterol (PROVENTIL HFA;VENTOLIN HFA) 108 (90 Base) MCG/ACT inhaler, Inhale 1-2 puffs into the lungs every 6 (six) hours as needed for wheezing or shortness of breath., Disp: 1 Inhaler, Rfl: 1 .  albuterol (PROVENTIL HFA;VENTOLIN HFA) 108 (90 Base) MCG/ACT inhaler, Inhale 2 puffs into the lungs every 4 (four) hours as needed for  wheezing., Disp: 1 Inhaler, Rfl: 0 .  fluticasone (FLONASE) 50 MCG/ACT nasal spray, Place 2 sprays into both nostrils daily., Disp: 16 g, Rfl: 0 .  ibuprofen (ADVIL,MOTRIN) 800 MG tablet, Take 1 tablet (800 mg total) by mouth 3 (three) times daily., Disp: 30 tablet, Rfl: 0  Allergies Patient has no known allergies.  Family History  Problem Relation Age of Onset  . Drug abuse      Parent, grandparent  . Arthritis    . Heart disease      Grandparent  . Sudden death      Grandparent    Social History Social History  Substance Use Topics  . Smoking status: Current Every Day Smoker    Packs/day: 1.00    Types: Cigarettes  . Smokeless tobacco: Never Used  . Alcohol use 7.2 oz/week    12 Standard drinks or equivalent per week     Comment: occasional    Review of Systems Constitutional: No fever/chills Eyes: No visual changes. ENT: No sore throat. Cardiovascular: Denies chest pain. Respiratory: Denies shortness of breath. Gastrointestinal: No abdominal pain.   Skin: Negative for rash.  ____________________________________________   PHYSICAL EXAM:  VITAL SIGNS: ED Triage Vitals [07/07/16 1255]  Enc Vitals Group     BP 109/65     Pulse Rate (!) 56     Resp 16     Temp 97.8 F (36.6 C)     Temp Source Oral     SpO2 100 %     Weight 175 lb (79.4 kg)     Height  (1.803 m)     Head Circumference  Peak Flow      Pain Score 0     Pain Loc      Pain Edu?      Excl. in GC?    Constitutional: Alert and oriented. Well appearing and in no acute distress. Eyes: Conjunctivae are normal. PERRL. EOMI. Head: Atraumatic. No sinus tenderness to palpation. No swelling. No erythema.  Ears: no erythema, normal TMs bilaterally.   Nose:No nasal congestion or rhinorrhea.  Mouth/Throat: Mucous membranes are moist. No pharyngeal erythema. No tonsillar swelling or exudate.  Neck: No stridor.  No cervical spine tenderness to palpation. Hematological/Lymphatic/Immunilogical:  No cervical lymphadenopathy. Cardiovascular: Normal rate, regular rhythm. Grossly normal heart sounds.  Good peripheral circulation. Respiratory: Normal respiratory effort.  No retractions. No wheezes, rales or rhonchi. Good air movement.  Musculoskeletal: Ambulatory with steady gait. Neurologic:  Normal speech and language. No gait instability. Skin:  Skin appears warm, dry and intact. No rash noted. Psychiatric: Mood and affect are normal. Speech and behavior are normal.  ___________________________________________   LABS (all labs ordered are listed, but only abnormal results are displayed)  Labs Reviewed - No data to display   PROCEDURES Procedures   INITIAL IMPRESSION / ASSESSMENT AND PLAN / ED COURSE  Pertinent labs & imaging results that were available during my care of the patient were reviewed by me and considered in my medical decision making (see chart for details).  Well-appearing patient. No acute distress. Patient with history of asthma requesting Rx refill of home albuterol treatment. Denies any acute complaints at this time. Albuterol refill Rx given. Encouraged supportive care, monitoring seasonal allergy over-the-counter use of Claritin or Zyrtec or Allegra as needed as well as establishing primary care physician.Discussed indication, risks and benefits of medications with patient.  Discussed follow up with Primary care physician this week. Discussed follow up and return parameters including no resolution or any worsening concerns. Patient verbalized understanding and agreed to plan.   ____________________________________________   FINAL CLINICAL IMPRESSION(S) / ED DIAGNOSES  Final diagnoses:  Asthma, unspecified asthma severity, unspecified whether complicated, unspecified whether persistent  Medication refill     Discharge Medication List as of 07/07/2016  1:06 PM    START taking these medications   Details  !! albuterol (PROVENTIL HFA;VENTOLIN HFA) 108  (90 Base) MCG/ACT inhaler Inhale 2 puffs into the lungs every 4 (four) hours as needed for wheezing., Starting Mon 07/07/2016, Normal     !! - Potential duplicate medications found. Please discuss with provider.      Note: This dictation was prepared with Dragon dictation along with smaller phrase technology. Any transcriptional errors that result from this process are unintentional.         Renford Dills, NP 07/07/16 2138

## 2016-07-07 NOTE — Discharge Instructions (Signed)
Take medication as prescribed. Rest. Drink plenty of fluids.  ° °Follow up with your primary care physician this week as needed. Return to Urgent care for new or worsening concerns.  ° °

## 2017-02-24 DIAGNOSIS — R079 Chest pain, unspecified: Secondary | ICD-10-CM | POA: Diagnosis not present

## 2017-02-24 DIAGNOSIS — R0789 Other chest pain: Secondary | ICD-10-CM | POA: Diagnosis not present

## 2017-02-24 DIAGNOSIS — R071 Chest pain on breathing: Secondary | ICD-10-CM | POA: Diagnosis not present

## 2017-07-27 ENCOUNTER — Ambulatory Visit
Admission: EM | Admit: 2017-07-27 | Discharge: 2017-07-27 | Disposition: A | Discharge status: Home / Self Care | Payer: Self-pay | Attending: Family Medicine | Admitting: Family Medicine

## 2017-07-27 ENCOUNTER — Encounter: Payer: Self-pay | Admitting: Emergency Medicine

## 2017-07-27 ENCOUNTER — Other Ambulatory Visit: Payer: Self-pay

## 2017-07-27 DIAGNOSIS — L723 Sebaceous cyst: Secondary | ICD-10-CM

## 2017-07-27 DIAGNOSIS — L089 Local infection of the skin and subcutaneous tissue, unspecified: Secondary | ICD-10-CM

## 2017-07-27 DIAGNOSIS — R221 Localized swelling, mass and lump, neck: Secondary | ICD-10-CM

## 2017-07-27 MED ORDER — SULFAMETHOXAZOLE-TRIMETHOPRIM 800-160 MG PO TABS: 1.0000 | ORAL_TABLET | Freq: Two times a day (BID) | ORAL | 0 refills | Status: DC

## 2017-07-27 NOTE — ED Provider Notes (Signed)
[facility]    CSN: [id number] Arrival date & time: 07/27/17  0841     History   Chief Complaint Chief Complaint  Patient presents with  . lump on neck    HPI Ryan Booth is a 22 y.o. male.   22 yo male with a c/o tender lump on the back of his neck for 3-4 days. States he's been putting warm compresses without relief. States he's had this before at the same location and had incision/ drainage. Patient states he had it done without anesthesia and states he doesn't want anesthesia now. States he's been poking at it with a fingerstick glucose needle to see if it would drain, but it hasn't.   The history is provided by the patient.    Past Medical History:  Diagnosis Date  . Alcohol problem drinking   . Arthritis   . Asthma   . GERD (gastroesophageal reflux disease)     Patient Active Problem List   Diagnosis Date Noted  . Otitis media 07/05/2015  . Mild intermittent asthma 07/05/2015  . Anxiety and depression 07/05/2015    History reviewed. No pertinent surgical history.     Home Medications    Prior to Admission medications   Medication Sig Start Date End Date Taking? Authorizing Provider  fexofenadine (ALLEGRA) 180 MG tablet Take 180 mg by mouth daily.   Yes [provider]  albuterol (PROVENTIL HFA;VENTOLIN HFA) 108 (90 Base) MCG/ACT inhaler Inhale 1-2 puffs into the lungs every 6 (six) hours as needed for wheezing or shortness of breath. 04/13/16   Lutricia Feil, PA-C  albuterol (PROVENTIL HFA;VENTOLIN HFA) 108 (90 Base) MCG/ACT inhaler Inhale 2 puffs into the lungs every 4 (four) hours as needed for wheezing. 07/07/16   Renford Dills, NP  fluticasone (FLONASE) 50 MCG/ACT nasal spray Place 2 sprays into both nostrils daily. 04/13/16   Lutricia Feil, PA-C  ibuprofen (ADVIL,MOTRIN) 800 MG tablet Take 1 tablet (800 mg total) by mouth 3 (three) times daily. 04/21/16   Domenick Gong, MD  sulfamethoxazole-trimethoprim (BACTRIM  DS,SEPTRA DS) 800-160 MG tablet Take 1 tablet by mouth 2 (two) times daily. 07/27/17   Payton Mccallum, MD    Family History Family History  Problem Relation Age of Onset  . Drug abuse Unknown        Parent, grandparent  . Arthritis Unknown   . Heart disease Unknown        Grandparent  . Sudden death Unknown        Grandparent    Social History Social History   Tobacco Use  . Smoking status: Current Every Day Smoker    Packs/day: 1.00    Types: Cigarettes  . Smokeless tobacco: Never Used  Substance Use Topics  . Alcohol use: Yes    Alcohol/week: 7.2 oz    Types: 12 Standard drinks or equivalent per week    Comment: occasional  . Drug use: Yes    Types: Marijuana     Allergies   Patient has no known allergies.   Review of Systems Review of Systems   Physical Exam Triage Vital Signs ED Triage Vitals  Enc Vitals Group     BP 07/27/17 0856 129/79     Pulse Rate 07/27/17 0856 78     Resp 07/27/17 0856 16     Temp 07/27/17 0856 98.6 F (37 C)     Temp Source 07/27/17 0856 Oral     SpO2 07/27/17 0856 100 %  Weight 07/27/17 0855 175 lb (79.4 kg)     Height 07/27/17 0855  (1.803 m)     Head Circumference --      Peak Flow --      Pain Score 07/27/17 0855 0     Pain Loc --      Pain Edu? --      Excl. in GC? --    No data found.  Updated Vital Signs BP 129/79 (BP Location: Left Arm)   Pulse 78   Temp 98.6 F (37 C) (Oral)   Resp 16   Ht  (1.803 m)   Wt 175 lb (79.4 kg)   SpO2 100%   BMI 24.41 kg/m   Visual Acuity Right Eye Distance:   Left Eye Distance:   Bilateral Distance:    Right Eye Near:   Left Eye Near:    Bilateral Near:     Physical Exam  Constitutional: He appears well-developed and well-nourished. No distress.  Skin: He is not diaphoretic. There is erythema.  approx 3cm posterior neck raised skin lesion, tender, erythematous; no drainage  Nursing note and vitals reviewed.    UC Treatments / Results  Labs (all  labs ordered are listed, but only abnormal results are displayed) Labs Reviewed - No data to display  EKG None  Radiology No results found.  Procedures Incision and Drainage Date/Time: 07/27/2017 4:46 PM Performed by: Payton Mccallum, MD Authorized by: Payton Mccallum, MD   Consent:    Consent obtained:  Verbal   Consent given by:  Patient   Risks discussed:  Bleeding, infection, incomplete drainage, damage to other organs and pain   Alternatives discussed:  No treatment Location:    Type:  Abscess   Size:  3cm   Location:  Neck   Neck location:  R posterior Pre-procedure details:    Skin preparation:  Betadine Anesthesia (see MAR for exact dosages):    Anesthesia method:  None (patient refused) Procedure type:    Complexity:  Simple Procedure details:    Needle aspiration: no     Incision types:  Stab incision   Incision depth:  Dermal   Scalpel blade:  11   Drainage:  Purulent   Drainage amount:  Moderate   Wound treatment:  Wound left open (bandaged)   Packing materials:  None Post-procedure details:    Patient tolerance of procedure:  Tolerated well, no immediate complications   (including critical care time)  Medications Ordered in UC Medications - No data to display  Initial Impression / Assessment and Plan / UC Course  I have reviewed the triage vital signs and the nursing notes.  Pertinent labs & imaging results that were available during my care of the patient were reviewed by me and considered in my medical decision making (see chart for details).      Final Clinical Impressions(s) / UC Diagnoses   Final diagnoses:  Infected sebaceous cyst   Discharge Instructions   None    ED Prescriptions    Medication Sig Dispense Auth. Provider   sulfamethoxazole-trimethoprim (BACTRIM DS,SEPTRA DS) 800-160 MG tablet Take 1 tablet by mouth 2 (two) times daily. 20 tablet Payton Mccallum, MD     1. diagnosis reviewed with patient 2. Procedure as per note  above 3.  rx as per orders above; reviewed possible side effects, interactions, risks and benefits  4. Recommend supportive treatment with warm compresses to area 5. Follow-up prn if symptoms worsen or don't improve Controlled Substance Prescriptions  [place] Controlled Substance Registry consulted? Not Applicable   Payton Mccallum, MD 07/27/17 [phone]

## 2017-07-27 NOTE — ED Triage Notes (Signed)
Patient in today c/o lump on the back on his neck x 3-4 days, getting bigger. Patient has had this before.

## 2018-09-22 ENCOUNTER — Encounter: Payer: Self-pay | Admitting: Emergency Medicine

## 2018-09-22 DIAGNOSIS — R509 Fever, unspecified: Secondary | ICD-10-CM | POA: Insufficient documentation

## 2018-09-22 DIAGNOSIS — R0602 Shortness of breath: Secondary | ICD-10-CM | POA: Insufficient documentation

## 2018-09-22 DIAGNOSIS — Z5321 Procedure and treatment not carried out due to patient leaving prior to being seen by health care provider: Secondary | ICD-10-CM | POA: Insufficient documentation

## 2018-09-22 DIAGNOSIS — R112 Nausea with vomiting, unspecified: Secondary | ICD-10-CM | POA: Insufficient documentation

## 2018-09-22 LAB — CBC
HCT: 47.5 % (ref 39.0–52.0)
Hemoglobin: 16.8 g/dL (ref 13.0–17.0)
MCH: 29 pg (ref 26.0–34.0)
MCHC: 35.4 g/dL (ref 30.0–36.0)
MCV: 82 fL (ref 80.0–100.0)
Platelets: 323 10*3/uL (ref 150–400)
RBC: 5.79 MIL/uL (ref 4.22–5.81)
RDW: 12.4 % (ref 11.5–15.5)
WBC: 15 10*3/uL — ABNORMAL HIGH (ref 4.0–10.5)
nRBC: 0 % (ref 0.0–0.2)

## 2018-09-22 LAB — COMPREHENSIVE METABOLIC PANEL
ALT: 23 U/L (ref 0–44)
AST: 22 U/L (ref 15–41)
Albumin: 5.3 g/dL — ABNORMAL HIGH (ref 3.5–5.0)
Alkaline Phosphatase: 90 U/L (ref 38–126)
Anion gap: 17 — ABNORMAL HIGH (ref 5–15)
BUN: 20 mg/dL (ref 6–20)
CO2: 29 mmol/L (ref 22–32)
Calcium: 10 mg/dL (ref 8.9–10.3)
Chloride: 94 mmol/L — ABNORMAL LOW (ref 98–111)
Creatinine, Ser: 1.02 mg/dL (ref 0.61–1.24)
GFR calc Af Amer: >60 mL/min (ref >60)
GFR calc non Af Amer: >60 mL/min (ref >60)
Glucose, Bld: 186 mg/dL — ABNORMAL HIGH (ref 70–99)
Potassium: 2.7 mmol/L — CL (ref 3.5–5.1)
Sodium: 140 mmol/L (ref 135–145)
Total Bilirubin: 1.1 mg/dL (ref 0.3–1.2)
Total Protein: 8.5 g/dL — ABNORMAL HIGH (ref 6.5–8.1)

## 2018-09-22 LAB — URINALYSIS, COMPLETE (UACMP) WITH MICROSCOPIC
Bacteria, UA: NONE SEEN
Glucose, UA: NEGATIVE mg/dL
Hgb urine dipstick: NEGATIVE
Ketones, ur: 20 mg/dL — AB
Leukocytes,Ua: NEGATIVE
Nitrite: NEGATIVE
Protein, ur: 100 mg/dL — AB
Specific Gravity, Urine: 1.033 — ABNORMAL HIGH (ref 1.005–1.030)
Squamous Epithelial / HPF: NONE SEEN (ref 0–5)
pH: 7 (ref 5.0–8.0)

## 2018-09-22 LAB — LIPASE, BLOOD: Lipase: 22 U/L (ref 11–51)

## 2018-09-22 LAB — TROPONIN I (HIGH SENSITIVITY): Troponin I (High Sensitivity): 13 ng/L (ref <18)

## 2018-09-22 MED ORDER — SODIUM CHLORIDE 0.9% FLUSH: 3.0000 mL | Freq: Once | INTRAVENOUS | Status: DC

## 2018-09-22 NOTE — ED Triage Notes (Signed)
Pt reports he has had N/V, fever x3 days and today became SOB. Pt has hx/o asthma and has used albuterol inhaler prior to ED visit. Pt is pale in color, pt shivering.

## 2018-09-23 ENCOUNTER — Emergency Department
Admission: EM | Admit: 2018-09-23 | Discharge: 2018-09-23 | Disposition: A | Discharge status: Home / Self Care | Payer: Self-pay | Attending: Emergency Medicine | Admitting: Emergency Medicine

## 2018-09-23 NOTE — ED Notes (Signed)
No answer when called several times from lobby 

## 2018-10-29 ENCOUNTER — Emergency Department: Payer: Self-pay

## 2018-10-29 ENCOUNTER — Emergency Department
Admission: EM | Admit: 2018-10-29 | Discharge: 2018-10-29 | Disposition: A | Discharge status: Other Acute Inpatient Hospital | Payer: Self-pay | Attending: Emergency Medicine | Admitting: Emergency Medicine

## 2018-10-29 ENCOUNTER — Other Ambulatory Visit: Payer: Self-pay

## 2018-10-29 DIAGNOSIS — J45909 Unspecified asthma, uncomplicated: Secondary | ICD-10-CM | POA: Insufficient documentation

## 2018-10-29 DIAGNOSIS — R4182 Altered mental status, unspecified: Secondary | ICD-10-CM | POA: Diagnosis not present

## 2018-10-29 DIAGNOSIS — R0602 Shortness of breath: Secondary | ICD-10-CM | POA: Diagnosis not present

## 2018-10-29 DIAGNOSIS — J982 Interstitial emphysema: Secondary | ICD-10-CM | POA: Insufficient documentation

## 2018-10-29 DIAGNOSIS — J9811 Atelectasis: Secondary | ICD-10-CM | POA: Diagnosis not present

## 2018-10-29 DIAGNOSIS — R4020 Unspecified coma: Secondary | ICD-10-CM | POA: Diagnosis not present

## 2018-10-29 DIAGNOSIS — Z20828 Contact with and (suspected) exposure to other viral communicable diseases: Secondary | ICD-10-CM | POA: Insufficient documentation

## 2018-10-29 DIAGNOSIS — K760 Fatty (change of) liver, not elsewhere classified: Secondary | ICD-10-CM | POA: Diagnosis not present

## 2018-10-29 DIAGNOSIS — F17210 Nicotine dependence, cigarettes, uncomplicated: Secondary | ICD-10-CM | POA: Insufficient documentation

## 2018-10-29 DIAGNOSIS — R112 Nausea with vomiting, unspecified: Secondary | ICD-10-CM | POA: Insufficient documentation

## 2018-10-29 DIAGNOSIS — R1110 Vomiting, unspecified: Secondary | ICD-10-CM | POA: Diagnosis not present

## 2018-10-29 DIAGNOSIS — Z79899 Other long term (current) drug therapy: Secondary | ICD-10-CM | POA: Insufficient documentation

## 2018-10-29 LAB — COMPREHENSIVE METABOLIC PANEL
ALT: 28 U/L (ref 0–44)
AST: 17 U/L (ref 15–41)
Albumin: 5.7 g/dL — ABNORMAL HIGH (ref 3.5–5.0)
Alkaline Phosphatase: 82 U/L (ref 38–126)
Anion gap: 20 — ABNORMAL HIGH (ref 5–15)
BUN: 34 mg/dL — ABNORMAL HIGH (ref 6–20)
CO2: 28 mmol/L (ref 22–32)
Calcium: 10.6 mg/dL — ABNORMAL HIGH (ref 8.9–10.3)
Chloride: 82 mmol/L — ABNORMAL LOW (ref 98–111)
Creatinine, Ser: 0.97 mg/dL (ref 0.61–1.24)
GFR calc Af Amer: >60 mL/min (ref >60)
GFR calc non Af Amer: >60 mL/min (ref >60)
Glucose, Bld: 118 mg/dL — ABNORMAL HIGH (ref 70–99)
Potassium: 2.9 mmol/L — ABNORMAL LOW (ref 3.5–5.1)
Sodium: 130 mmol/L — ABNORMAL LOW (ref 135–145)
Total Bilirubin: 2.3 mg/dL — ABNORMAL HIGH (ref 0.3–1.2)
Total Protein: 8.9 g/dL — ABNORMAL HIGH (ref 6.5–8.1)

## 2018-10-29 LAB — URINALYSIS, COMPLETE (UACMP) WITH MICROSCOPIC
Bacteria, UA: NONE SEEN
Bilirubin Urine: NEGATIVE
Glucose, UA: NEGATIVE mg/dL
Hgb urine dipstick: NEGATIVE
Ketones, ur: 80 mg/dL — AB
Leukocytes,Ua: NEGATIVE
Nitrite: NEGATIVE
Protein, ur: 30 mg/dL — AB
Specific Gravity, Urine: 1.025 (ref 1.005–1.030)
Squamous Epithelial / HPF: NONE SEEN (ref 0–5)
pH: 6 (ref 5.0–8.0)

## 2018-10-29 LAB — CBC WITH DIFFERENTIAL/PLATELET
Abs Immature Granulocytes: 0.13 10*3/uL — ABNORMAL HIGH (ref 0.00–0.07)
Basophils Absolute: 0 10*3/uL (ref 0.0–0.1)
Basophils Relative: 0 %
Eosinophils Absolute: 0 10*3/uL (ref 0.0–0.5)
Eosinophils Relative: 0 %
HCT: 50 % (ref 39.0–52.0)
Hemoglobin: 18.5 g/dL — ABNORMAL HIGH (ref 13.0–17.0)
Immature Granulocytes: 1 %
Lymphocytes Relative: 10 %
Lymphs Abs: 2.1 10*3/uL (ref 0.7–4.0)
MCH: 29.5 pg (ref 26.0–34.0)
MCHC: 37 g/dL — ABNORMAL HIGH (ref 30.0–36.0)
MCV: 79.6 fL — ABNORMAL LOW (ref 80.0–100.0)
Monocytes Absolute: 2.4 10*3/uL — ABNORMAL HIGH (ref 0.1–1.0)
Monocytes Relative: 11 %
Neutro Abs: 17.2 10*3/uL — ABNORMAL HIGH (ref 1.7–7.7)
Neutrophils Relative %: 78 %
Platelets: 403 10*3/uL — ABNORMAL HIGH (ref 150–400)
RBC: 6.28 MIL/uL — ABNORMAL HIGH (ref 4.22–5.81)
RDW: 12.1 % (ref 11.5–15.5)
WBC: 21.8 10*3/uL — ABNORMAL HIGH (ref 4.0–10.5)
nRBC: 0 % (ref 0.0–0.2)

## 2018-10-29 LAB — SARS CORONAVIRUS 2 BY RT PCR (HOSPITAL ORDER, PERFORMED IN ~~LOC~~ HOSPITAL LAB): SARS Coronavirus 2: NEGATIVE

## 2018-10-29 LAB — URINE DRUG SCREEN, QUALITATIVE (ARMC ONLY)
Amphetamines, Ur Screen: NOT DETECTED
Barbiturates, Ur Screen: NOT DETECTED
Benzodiazepine, Ur Scrn: NOT DETECTED
Cannabinoid 50 Ng, Ur ~~LOC~~: POSITIVE — AB
Cocaine Metabolite,Ur ~~LOC~~: NOT DETECTED
MDMA (Ecstasy)Ur Screen: NOT DETECTED
Methadone Scn, Ur: NOT DETECTED
Opiate, Ur Screen: NOT DETECTED
Phencyclidine (PCP) Ur S: NOT DETECTED
Tricyclic, Ur Screen: NOT DETECTED

## 2018-10-29 LAB — CBC
HCT: 50.1 % (ref 39.0–52.0)
Hemoglobin: 18.4 g/dL — ABNORMAL HIGH (ref 13.0–17.0)
MCH: 29.2 pg (ref 26.0–34.0)
MCHC: 36.7 g/dL — ABNORMAL HIGH (ref 30.0–36.0)
MCV: 79.5 fL — ABNORMAL LOW (ref 80.0–100.0)
Platelets: 402 10*3/uL — ABNORMAL HIGH (ref 150–400)
RBC: 6.3 MIL/uL — ABNORMAL HIGH (ref 4.22–5.81)
RDW: 12 % (ref 11.5–15.5)
WBC: 21.8 10*3/uL — ABNORMAL HIGH (ref 4.0–10.5)
nRBC: 0 % (ref 0.0–0.2)

## 2018-10-29 LAB — LIPASE, BLOOD: Lipase: 18 U/L (ref 11–51)

## 2018-10-29 LAB — MAGNESIUM: Magnesium: 2.5 mg/dL — ABNORMAL HIGH (ref 1.7–2.4)

## 2018-10-29 MED ORDER — IOHEXOL 300 MG/ML  SOLN
75.0000 mL | Freq: Once | INTRAMUSCULAR | Status: AC | PRN
  Administered 2018-10-29: 75 mL via INTRAVENOUS

## 2018-10-29 MED ORDER — SODIUM CHLORIDE 0.9 % IV SOLN
1.0000 g | Freq: Once | INTRAVENOUS | Status: AC
  Administered 2018-10-29: 1 g via INTRAVENOUS
  Filled 2018-10-29: qty 1

## 2018-10-29 MED ORDER — SODIUM CHLORIDE 0.9% FLUSH
3.0000 mL | Freq: Once | INTRAVENOUS | Status: AC
  Administered 2018-10-29: 3 mL via INTRAVENOUS

## 2018-10-29 MED ORDER — SODIUM CHLORIDE 0.9 % IV BOLUS
1000.0000 mL | Freq: Once | INTRAVENOUS | Status: AC
  Administered 2018-10-29: 1000 mL via INTRAVENOUS

## 2018-10-29 MED ORDER — LORAZEPAM 2 MG/ML IJ SOLN
1.0000 mg | Freq: Once | INTRAMUSCULAR | Status: AC
  Administered 2018-10-29: 21:00:00 1 mg via INTRAVENOUS
  Filled 2018-10-29: qty 1

## 2018-10-29 MED ORDER — NALOXONE HCL 2 MG/2ML IJ SOSY
PREFILLED_SYRINGE | INTRAMUSCULAR | Status: AC
  Filled 2018-10-29: qty 2

## 2018-10-29 NOTE — ED Notes (Signed)
Report given to Jenny Reichmann, Therapist, sports and [employer] at [facility]

## 2018-10-29 NOTE — ED Notes (Signed)
Pt states he is leaving, pt reports he does not trust [place] and does not trust the imaging that was done on him. PT states we are "trying to keep him in here so he can look at the walls". Dr. Cinda Quest informed.

## 2018-10-29 NOTE — ED Notes (Signed)
Pt otf for imaging 

## 2018-10-29 NOTE — ED Triage Notes (Addendum)
Pt comes via POV with family with c/o vomiting, SOB, possible dehydration, and AMS. Pt is alert but unsure of where he is. Pt also c/o pain with urination and possible kidney stones.  Pt states this started yesterday and vomiting started about 4 days ago.  Family also states pt is unable to close fists all the way. Pt unable to keep anything down.  Pt states this same incident happened a month ago and he came to ER but left before seeing a doctor.

## 2018-10-29 NOTE — ED Notes (Addendum)
Pt speaking with this RN in NAD, A&Ox3 at this time, states year is 2019. PT reports vomiting x 3 days with [facility] starting today. PT has a hx of asthma. Pt reports difficulty with urination but girlfriend states he has had trouble with this x 1 month.

## 2018-10-29 NOTE — ED Provider Notes (Addendum)
[facility] Emergency Department Provider Note   ____________________________________________   First MD Initiated Contact with Patient 10/29/18 1817     (approximate)  I have reviewed the triage vital signs and the nursing notes.   HISTORY  Chief Complaint Shortness of Breath, Emesis, and Altered Mental Status    HPI Ryan Booth is a 23 y.o. male who comes into the emergency room thinking is February 2019.  When I do finger-to-nose he has difficulty finding his nose with his left hand in fact he has difficulty understanding instructions to touch his nose tries to touch my finger down but is able to do these things with his right hand.  Patient has been vomiting for the last couple days.  He denies any belly pain or headache he had a temperature of 99 yesterday but no other fever.  He told the nurses he is not using drugs.         Past Medical History:  Diagnosis Date   Alcohol problem drinking    Arthritis    Asthma    GERD (gastroesophageal reflux disease)     Patient Active Problem List   Diagnosis Date Noted   Otitis media 07/05/2015   Mild intermittent asthma 07/05/2015   Anxiety and depression 07/05/2015    History reviewed. No pertinent surgical history.  Prior to Admission medications   Medication Sig Start Date End Date Taking? Authorizing Provider  albuterol (PROVENTIL HFA;VENTOLIN HFA) 108 (90 Base) MCG/ACT inhaler Inhale 1-2 puffs into the lungs every 6 (six) hours as needed for wheezing or shortness of breath. 04/13/16   Lorin Picket, PA-C  fexofenadine (ALLEGRA) 180 MG tablet Take 180 mg by mouth daily.    [provider]  fluticasone (FLONASE) 50 MCG/ACT nasal spray Place 2 sprays into both nostrils daily. 04/13/16   Lorin Picket, PA-C  ibuprofen (ADVIL,MOTRIN) 800 MG tablet Take 1 tablet (800 mg total) by mouth 3 (three) times daily. 04/21/16   Melynda Ripple, MD    Allergies Patient has no  known allergies.  Family History  Problem Relation Age of Onset   Drug abuse Other        Parent, grandparent   Arthritis Other    Heart disease Other        Grandparent   Sudden death Other        Grandparent    Social History Social History   Tobacco Use   Smoking status: Current Every Day Smoker    Packs/day: 1.00    Types: Cigarettes   Smokeless tobacco: Never Used  Substance Use Topics   Alcohol use: Yes    Alcohol/week: 12.0 standard drinks    Types: 12 Standard drinks or equivalent per week    Comment: occasional   Drug use: Yes    Types: Marijuana    Review of Systems  Constitutional: No fever/chills Eyes: No visual changes. ENT: No sore throat. Cardiovascular: Denies chest pain. Respiratory: Denies shortness of breath. Gastrointestinal: No abdominal pain.   nausea,  vomiting.  No diarrhea.  No constipation. Genitourinary: Negative for dysuria. Musculoskeletal: Negative for back pain. Skin: Negative for rash. Neurological: Negative for headaches, focal weakness  ____________________________________________   PHYSICAL EXAM:  VITAL SIGNS: ED Triage Vitals [10/29/18 1707]  Enc Vitals Group     BP 120/86     Pulse Rate (!) 112     Resp 20     Temp 98.4 F (36.9 C)     Temp src  SpO2 96 %     Weight 170 lb (77.1 kg)     Height 5\' 10"  (1.778 m)     Head Circumference      Peak Flow      Pain Score 0     Pain Loc      Pain Edu?      Excl. in GC?     Constitutional: Alert able to answer questions but occasionally does not make sense he looks in no distress whatsoever Eyes: Conjunctivae are normal. PER. EOMI. both are slightly dilated Head: Atraumatic. Nose: No congestion/rhinnorhea. Mouth/Throat: Mucous membranes are moist.  Oropharynx non-erythematous. Neck: No stridor.  Cardiovascular: Normal rate, regular rhythm. Grossly normal heart sounds.  Good peripheral circulation. Respiratory: Normal respiratory effort.  No retractions.  Lungs CTAB. Gastrointestinal: Soft and nontender. No distention. No abdominal bruits.  Musculoskeletal: No lower extremity tenderness nor edema.   Neurologic:  Normal speech and language. No gross focal neurologic deficits are appreciated.  Patient moving all extremities equally and well but has some trouble following instructions especially as I noted above in HPI with the finger-to-nose Skin:  Skin is warm, dry and intact. No rash noted.   ____________________________________________   LABS (all labs ordered are listed, but only abnormal results are displayed)  Labs Reviewed  COMPREHENSIVE METABOLIC PANEL - Abnormal; Notable for the following components:      Result Value   Sodium 130 (*)    Potassium 2.9 (*)    Chloride 82 (*)    Glucose, Bld 118 (*)    BUN 34 (*)    Calcium 10.6 (*)    Total Protein 8.9 (*)    Albumin 5.7 (*)    Total Bilirubin 2.3 (*)    Anion gap 20 (*)    All other components within normal limits  CBC - Abnormal; Notable for the following components:   WBC 21.8 (*)    RBC 6.30 (*)    Hemoglobin 18.4 (*)    MCV 79.5 (*)    MCHC 36.7 (*)    Platelets 402 (*)    All other components within normal limits  URINALYSIS, COMPLETE (UACMP) WITH MICROSCOPIC - Abnormal; Notable for the following components:   Color, Urine YELLOW (*)    APPearance CLEAR (*)    Ketones, ur 80 (*)    Protein, ur 30 (*)    All other components within normal limits  MAGNESIUM - Abnormal; Notable for the following components:   Magnesium 2.5 (*)    All other components within normal limits  URINE DRUG SCREEN, QUALITATIVE ([facility] ONLY) - Abnormal; Notable for the following components:   Cannabinoid 50 Ng, Ur Vantage POSITIVE (*)    All other components within normal limits  CBC WITH DIFFERENTIAL/PLATELET - Abnormal; Notable for the following components:   WBC 21.8 (*)    RBC 6.28 (*)    Hemoglobin 18.5 (*)    MCV 79.6 (*)    MCHC 37.0 (*)    Platelets 403 (*)    Neutro Abs 17.2 (*)     Monocytes Absolute 2.4 (*)    Abs Immature Granulocytes 0.13 (*)    All other components within normal limits  SARS CORONAVIRUS 2 (HOSPITAL ORDER, PERFORMED IN [facility])  LIPASE, BLOOD   ____________________________________________  EKG  EKG read interpreted by me shows sinus tachycardia rate of 109 rightward axis computer is reading right atrial enlargement this is probably correct.  Nonspecific ST-T changes ____________________________________________  RADIOLOGY  ED MD  interpretation:    Official radiology report(s): Ct Head Wo Contrast  Result Date: 10/29/2018 CLINICAL DATA:  Altered level of consciousness. EXAM: CT HEAD WITHOUT CONTRAST TECHNIQUE: Contiguous axial images were obtained from the base of the skull through the vertex without intravenous contrast. COMPARISON:  None. FINDINGS: Brain: No evidence of acute infarction, hemorrhage, hydrocephalus, extra-axial collection or mass lesion/mass effect. Vascular: No hyperdense vessel or unexpected calcification. Skull: No evidence for fracture. No worrisome lytic or sclerotic lesion. Sinuses/Orbits: The visualized paranasal sinuses and mastoid air cells are clear. Visualized portions of the globes and intraorbital fat are unremarkable. Other: Trace amount of gas is seen around the right carotid artery, below skull base. IMPRESSION: 1. No acute intracranial abnormality. 2. Trace gas identified inferior to the right skull base, tracking around the right internal carotid artery. Soft tissue gas could potentially be tracking up from pneumomediastinum. I personally called these results to Dr. Darnelle Catalan in the emergency department at 1847 hours on 10/29/2018. Electronically Signed   By: Kennith Center M.D.   On: 10/29/2018 18:48   Ct Chest W Contrast  Result Date: 10/29/2018 CLINICAL DATA:  Altered mental status, vomiting EXAM: CT CHEST, ABDOMEN, AND PELVIS WITH CONTRAST TECHNIQUE: Multidetector CT imaging of the chest, abdomen  and pelvis was performed following the standard protocol during bolus administration of intravenous contrast. CONTRAST:  75mL OMNIPAQUE IOHEXOL 300 MG/ML  SOLN COMPARISON:  Same day chest radiograph and CT brain FINDINGS: CT CHEST FINDINGS Cardiovascular: No significant vascular findings. Normal heart size. No pericardial effusion. Mediastinum/Nodes: There is extensive pneumomediastinum, extending into the imaged portion of the lower neck and about the epidural space. No enlarged mediastinal, hilar, or axillary lymph nodes. Age-appropriate thymic remnant in the anterior mediastinum. Thyroid gland, trachea, and esophagus demonstrate no significant findings. Lungs/Pleura: Lungs are clear. No pleural effusion or pneumothorax. Musculoskeletal: No chest wall mass or suspicious bone lesions identified. CT ABDOMEN PELVIS FINDINGS Hepatobiliary: No solid liver abnormality is seen. Hepatic steatosis. No gallstones, gallbladder wall thickening, or biliary dilatation. Pancreas: Unremarkable. No pancreatic ductal dilatation or surrounding inflammatory changes. Spleen: Normal in size without significant abnormality. Adrenals/Urinary Tract: Adrenal glands are unremarkable. Kidneys are normal, without renal calculi, solid lesion, or hydronephrosis. Bladder is unremarkable. Stomach/Bowel: Stomach is within normal limits. Appendix appears normal. No evidence of bowel wall thickening, distention, or inflammatory changes. Vascular/Lymphatic: No significant vascular findings are present. No enlarged abdominal or pelvic lymph nodes. Reproductive: No mass or other abnormality. Other: No abdominal wall hernia or abnormality. No abdominopelvic ascites. Musculoskeletal: No acute or significant osseous findings. IMPRESSION: 1. There is extensive pneumomediastinum, extending into the imaged portion of the lower neck and about the epidural space. 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 There is no focal disruption of the esophagus or trachea noted. 2.  Hepatic steatosis. Electronically Signed   By: Lauralyn Primes M.D.   On: 10/29/2018 19:47   Ct Abdomen Pelvis W Contrast  Result Date: 10/29/2018 CLINICAL DATA:  Altered mental status, vomiting EXAM: CT CHEST, ABDOMEN, AND PELVIS WITH CONTRAST TECHNIQUE: Multidetector CT imaging of the chest, abdomen and pelvis was performed following the standard protocol during bolus administration of intravenous contrast. CONTRAST:  75mL OMNIPAQUE IOHEXOL 300 MG/ML  SOLN COMPARISON:  Same day chest radiograph and CT brain FINDINGS: CT CHEST FINDINGS Cardiovascular: No significant vascular findings. Normal heart size. No pericardial effusion.  Mediastinum/Nodes: There is extensive pneumomediastinum, extending into the imaged portion of the lower neck and about the epidural space. No enlarged mediastinal, hilar, or axillary lymph nodes. Age-appropriate thymic remnant in the anterior mediastinum. Thyroid gland, trachea, and esophagus demonstrate no significant findings. Lungs/Pleura: Lungs are clear. No pleural effusion or pneumothorax. Musculoskeletal: No chest wall mass or suspicious bone lesions identified. CT ABDOMEN PELVIS FINDINGS Hepatobiliary: No solid liver abnormality is seen. Hepatic steatosis. No gallstones, gallbladder wall thickening, or biliary dilatation. Pancreas: Unremarkable. No pancreatic ductal dilatation or surrounding inflammatory changes. Spleen: Normal in size without significant abnormality. Adrenals/Urinary Tract: Adrenal glands are unremarkable. Kidneys are normal, without renal calculi, solid lesion, or hydronephrosis. Bladder is unremarkable. Stomach/Bowel: Stomach is within normal limits. Appendix appears normal. No evidence of bowel wall thickening, distention, or inflammatory changes. Vascular/Lymphatic: No significant vascular  findings are present. No enlarged abdominal or pelvic lymph nodes. Reproductive: No mass or other abnormality. Other: No abdominal wall hernia or abnormality. No abdominopelvic ascites. Musculoskeletal: No acute or significant osseous findings. IMPRESSION: 1. There is extensive pneumomediastinum, extending into the imaged portion of the lower neck and about the epidural space. 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 There is no focal disruption of the esophagus or trachea noted. 2.  Hepatic steatosis. Electronically Signed   By: Lauralyn PrimesAlex  Bibbey M.D.   On: 10/29/2018 19:47   Dg Chest Portable 1 View  Addendum Date: 10/29/2018   ADDENDUM REPORT: 10/29/2018 18:45 ADDENDUM: Upon further review, subtle pneumomediastinum is seen at the RIGHT superior mediastinum. No definite pneumothorax. Additional tiny focus of gas is identified cranial to the RIGHT first rib, uncertain etiology. Findings called to Dr. Darnelle CatalanMalinda on 10/29/2018 at 1843 hrs. Electronically Signed   By: Ulyses SouthwardMark  Boles M.D.   On: 10/29/2018 18:45   Result Date: 10/29/2018 CLINICAL DATA:  Shortness of breath, vomiting, altered mental status, possible dehydration, pain with urination EXAM: PORTABLE CHEST 1 VIEW COMPARISON:  Portable exam 1817 hours compared to 07/02/2009 FINDINGS: Kyphotic positioning. Normal heart size, mediastinal contours, and pulmonary vascularity. Mild RIGHT basilar atelectasis. No definite infiltrate, pleural effusion or pneumothorax. Osseous structures unremarkable. IMPRESSION: Mild RIGHT basilar atelectasis. Electronically Signed: By: Ulyses SouthwardMark  Boles M.D. On: 10/29/2018 18:34    ____________________________________________   PROCEDURES  Procedure(s) performed (including Critical Care):  Procedures   ____________________________________________   INITIAL IMPRESSION /  ASSESSMENT AND PLAN / ED COURSE  Ryan Booth was evaluated in Emergency Department on 10/29/2018 for the symptoms described in the history of present illness. He was evaluated in the context of the global COVID-19 pandemic, which necessitated consideration that the patient might be at risk for infection with the SARS-CoV-2 virus that causes COVID-19. Institutional protocols and algorithms that pertain to the evaluation of patients at risk for COVID-19 are in a state of rapid change based on information released by regulatory bodies including the CDC and federal and state organizations. These policies and algorithms were followed during the patient's care in the ED.      [facility] with [facility]. [facility] but [facility] do not have any ICU beds so is on a waiting list there I next [facility] the [facility] and they are looking for bed right now.  I am uncertain that the patient for sure has Boerhaave syndrome however it certainly worrisome for that with all  of his free air in the vomiting that he was having.  He also has a very high white count which is worrisome.  He has had a white count intermittently in the past but never quite this high.       ____________________________________________   FINAL CLINICAL IMPRESSION(S) / ED DIAGNOSES  Final diagnoses:  Pneumomediastinum (HCC)  Nausea and vomiting, intractability of vomiting not specified, unspecified vomiting type     ED Discharge Orders    None       Note:  This document was prepared using Dragon voice recognition software and may include unintentional dictation errors.    Arnaldo NatalMalinda, Izaya Netherton F, MD 10/29/18 [id number]    Arnaldo NatalMalinda, Kristianna Saperstein F, MD 11/09/18 [phone]

## 2018-10-31 ENCOUNTER — Encounter: Payer: Self-pay | Admitting: Emergency Medicine

## 2018-10-31 ENCOUNTER — Other Ambulatory Visit: Payer: Self-pay

## 2018-10-31 ENCOUNTER — Inpatient Hospital Stay
Admission: EM | Admit: 2018-10-31 | Discharge: 2018-11-03 | DRG: 897 | Disposition: A | Discharge status: Home / Self Care | Payer: Self-pay | Attending: Internal Medicine | Admitting: Internal Medicine

## 2018-10-31 ENCOUNTER — Emergency Department: Payer: 59

## 2018-10-31 DIAGNOSIS — Z8249 Family history of ischemic heart disease and other diseases of the circulatory system: Secondary | ICD-10-CM

## 2018-10-31 DIAGNOSIS — F13939 Sedative, hypnotic or anxiolytic use, unspecified with withdrawal, unspecified: Secondary | ICD-10-CM | POA: Diagnosis present

## 2018-10-31 DIAGNOSIS — Y909 Presence of alcohol in blood, level not specified: Secondary | ICD-10-CM | POA: Diagnosis present

## 2018-10-31 DIAGNOSIS — F17210 Nicotine dependence, cigarettes, uncomplicated: Secondary | ICD-10-CM | POA: Diagnosis present

## 2018-10-31 DIAGNOSIS — J45909 Unspecified asthma, uncomplicated: Secondary | ICD-10-CM | POA: Diagnosis present

## 2018-10-31 DIAGNOSIS — Z20828 Contact with and (suspected) exposure to other viral communicable diseases: Secondary | ICD-10-CM | POA: Diagnosis present

## 2018-10-31 DIAGNOSIS — F10231 Alcohol dependence with withdrawal delirium: Secondary | ICD-10-CM | POA: Diagnosis present

## 2018-10-31 DIAGNOSIS — E876 Hypokalemia: Secondary | ICD-10-CM | POA: Diagnosis present

## 2018-10-31 DIAGNOSIS — F10931 Alcohol use, unspecified with withdrawal delirium: Secondary | ICD-10-CM

## 2018-10-31 DIAGNOSIS — Z79899 Other long term (current) drug therapy: Secondary | ICD-10-CM

## 2018-10-31 DIAGNOSIS — R4182 Altered mental status, unspecified: Secondary | ICD-10-CM

## 2018-10-31 DIAGNOSIS — K219 Gastro-esophageal reflux disease without esophagitis: Secondary | ICD-10-CM | POA: Diagnosis present

## 2018-10-31 DIAGNOSIS — F13239 Sedative, hypnotic or anxiolytic dependence with withdrawal, unspecified: Secondary | ICD-10-CM | POA: Diagnosis present

## 2018-10-31 DIAGNOSIS — F1290 Cannabis use, unspecified, uncomplicated: Secondary | ICD-10-CM | POA: Diagnosis present

## 2018-10-31 DIAGNOSIS — F13231 Sedative, hypnotic or anxiolytic dependence with withdrawal delirium: Principal | ICD-10-CM | POA: Diagnosis present

## 2018-10-31 DIAGNOSIS — Z23 Encounter for immunization: Secondary | ICD-10-CM

## 2018-10-31 DIAGNOSIS — R6510 Systemic inflammatory response syndrome (SIRS) of non-infectious origin without acute organ dysfunction: Secondary | ICD-10-CM | POA: Diagnosis present

## 2018-10-31 LAB — CBC
HCT: 43.3 % (ref 39.0–52.0)
Hemoglobin: 15.3 g/dL (ref 13.0–17.0)
MCH: 29.1 pg (ref 26.0–34.0)
MCHC: 35.3 g/dL (ref 30.0–36.0)
MCV: 82.5 fL (ref 80.0–100.0)
Platelets: 275 10*3/uL (ref 150–400)
RBC: 5.25 MIL/uL (ref 4.22–5.81)
RDW: 11.9 % (ref 11.5–15.5)
WBC: 12.8 10*3/uL — ABNORMAL HIGH (ref 4.0–10.5)
nRBC: 0 % (ref 0.0–0.2)

## 2018-10-31 LAB — COMPREHENSIVE METABOLIC PANEL
ALT: 18 U/L (ref 0–44)
AST: 14 U/L — ABNORMAL LOW (ref 15–41)
Albumin: 4.4 g/dL (ref 3.5–5.0)
Alkaline Phosphatase: 67 U/L (ref 38–126)
Anion gap: 10 (ref 5–15)
BUN: 22 mg/dL — ABNORMAL HIGH (ref 6–20)
CO2: 26 mmol/L (ref 22–32)
Calcium: 9.3 mg/dL (ref 8.9–10.3)
Chloride: 100 mmol/L (ref 98–111)
Creatinine, Ser: 0.48 mg/dL — ABNORMAL LOW (ref 0.61–1.24)
GFR calc Af Amer: >60 mL/min (ref >60)
GFR calc non Af Amer: >60 mL/min (ref >60)
Glucose, Bld: 137 mg/dL — ABNORMAL HIGH (ref 70–99)
Potassium: 3 mmol/L — ABNORMAL LOW (ref 3.5–5.1)
Sodium: 136 mmol/L (ref 135–145)
Total Bilirubin: 1 mg/dL (ref 0.3–1.2)
Total Protein: 7.2 g/dL (ref 6.5–8.1)

## 2018-10-31 LAB — URINE DRUG SCREEN, QUALITATIVE (ARMC ONLY)
Amphetamines, Ur Screen: NOT DETECTED
Barbiturates, Ur Screen: NOT DETECTED
Benzodiazepine, Ur Scrn: POSITIVE — AB
Cannabinoid 50 Ng, Ur ~~LOC~~: POSITIVE — AB
Cocaine Metabolite,Ur ~~LOC~~: NOT DETECTED
MDMA (Ecstasy)Ur Screen: NOT DETECTED
Methadone Scn, Ur: NOT DETECTED
Opiate, Ur Screen: NOT DETECTED
Phencyclidine (PCP) Ur S: NOT DETECTED
Tricyclic, Ur Screen: NOT DETECTED

## 2018-10-31 LAB — URINALYSIS, COMPLETE (UACMP) WITH MICROSCOPIC
Bilirubin Urine: NEGATIVE
Glucose, UA: NEGATIVE mg/dL
Hgb urine dipstick: NEGATIVE
Ketones, ur: 80 mg/dL — AB
Leukocytes,Ua: NEGATIVE
Nitrite: NEGATIVE
Protein, ur: NEGATIVE mg/dL
Specific Gravity, Urine: 1.031 — ABNORMAL HIGH (ref 1.005–1.030)
Squamous Epithelial / HPF: NONE SEEN (ref 0–5)
pH: 6 (ref 5.0–8.0)

## 2018-10-31 LAB — LACTIC ACID, PLASMA: Lactic Acid, Venous: 1.1 mmol/L (ref 0.5–1.9)

## 2018-10-31 LAB — AMMONIA: Ammonia: 22 umol/L (ref 9–35)

## 2018-10-31 LAB — ETHANOL: Alcohol, Ethyl (B): <10 mg/dL (ref <10)

## 2018-10-31 LAB — LIPASE, BLOOD: Lipase: 30 U/L (ref 11–51)

## 2018-10-31 MED ORDER — LORAZEPAM 2 MG PO TABS: 2.0000 mg | ORAL_TABLET | Freq: Once | ORAL | Status: DC

## 2018-10-31 MED ORDER — IPRATROPIUM BROMIDE 0.02 % IN SOLN
500.00 | RESPIRATORY_TRACT | Status: DC
Start: ? — End: 2018-10-31

## 2018-10-31 MED ORDER — LORAZEPAM 2 MG/ML IJ SOLN
2.0000 mg | Freq: Once | INTRAMUSCULAR | Status: AC
  Administered 2018-10-31: 2 mg via INTRAMUSCULAR
  Filled 2018-10-31: qty 1

## 2018-10-31 MED ORDER — HEPARIN SODIUM (PORCINE) 5000 UNIT/ML IJ SOLN
5000.00 | INTRAMUSCULAR | Status: DC
Start: 2018-10-30 — End: 2018-10-31

## 2018-10-31 MED ORDER — ZIPRASIDONE MESYLATE 20 MG IM SOLR
20.0000 mg | Freq: Once | INTRAMUSCULAR | Status: AC
  Administered 2018-10-31: 20 mg via INTRAMUSCULAR
  Filled 2018-10-31: qty 20

## 2018-10-31 MED ORDER — ONDANSETRON HCL 4 MG/2ML IJ SOLN
4.00 | INTRAMUSCULAR | Status: DC
Start: ? — End: 2018-10-31

## 2018-10-31 MED ORDER — NICOTINE 14 MG/24HR TD PT24
1.00 | MEDICATED_PATCH | TRANSDERMAL | Status: DC
Start: 2018-10-30 — End: 2018-10-31

## 2018-10-31 MED ORDER — POTASSIUM CHLORIDE 10 MEQ/100ML IV SOLN
10.00 | INTRAVENOUS | Status: DC
Start: ? — End: 2018-10-31

## 2018-10-31 MED ORDER — SODIUM CHLORIDE 0.9% FLUSH: 3.0000 mL | Freq: Once | INTRAVENOUS | Status: DC

## 2018-10-31 NOTE — ED Notes (Signed)
TTS consulted with the pts nurse to reevaluate pts appropriateness for psychiatric assesment. Per nursing report pt continues to have behavioral issues/ outburst. Pt given IM for agitation and is currently sedated.

## 2018-10-31 NOTE — ED Notes (Signed)
IVC moved to room 20

## 2018-10-31 NOTE — ED Notes (Addendum)
Pt's mother, Jenny Reichmann, was given password [phone] and will be in contact. She would like to be kept informed of pt's status.   [phone]

## 2018-10-31 NOTE — ED Notes (Signed)
Pt is laying in bed with his eyes closed. This EDT offered pt another blanket or a snack, the pt said he was ok for now.

## 2018-10-31 NOTE — ED Provider Notes (Signed)
[facility] Emergency Department Provider Note ____________________________________________   First MD Initiated Contact with Patient 10/31/18 1507     (approximate)  I have reviewed the triage vital signs and the nursing notes.   HISTORY  Chief Complaint Altered Mental Status  Level 5 caveat: History of present illness limited due to altered mental status  HPI Ryan Booth is a 23 y.o. male with PMH as noted below who presents with altered mental status over the last several days, gradual onset, and worsening today.  The patient initially was seen in the ED 2 days ago with persistent nausea and vomiting.  Work-up revealed findings concerning for pneumomediastinum and the patient was [facility] for further evaluation.  He apparently was discharged yesterday.  The mother states that since that time he has been increasingly confused, although he was already somewhat confused during the admission there.  The mother reports that the patient is answering questions that nobody is asked him, talking to himself and to walls, and making no sense.  She reports that he has a history of marijuana use and there is also some concern about possibly abusing Xanax.  With the mother out of the room, the patient denies both of these.  The mother also reports that the patient had an episode in which he was involuntarily committed with depression and suicidal ideation around the age of 23 but does not have other psychiatric history.  The patient has had no nausea or vomiting today.  The patient himself states he feels fine and states he is here because of his grandmother and her wanting him to not be at home.   Past Medical History:  Diagnosis Date  . Alcohol problem drinking   . Arthritis   . Asthma   . GERD (gastroesophageal reflux disease)     Patient Active Problem List   Diagnosis Date Noted  . Otitis media 07/05/2015  . Mild  intermittent asthma 07/05/2015  . Anxiety and depression 07/05/2015    History reviewed. No pertinent surgical history.  Prior to Admission medications   Medication Sig Start Date End Date Taking? Authorizing Provider  omeprazole (PRILOSEC) 20 MG capsule Take 20 mg by mouth daily.   Yes [provider]  albuterol (PROVENTIL HFA;VENTOLIN HFA) 108 (90 Base) MCG/ACT inhaler Inhale 1-2 puffs into the lungs every 6 (six) hours as needed for wheezing or shortness of breath. 04/13/16   Lutricia Feiloemer, William P, PA-C  fexofenadine (ALLEGRA) 180 MG tablet Take 180 mg by mouth daily.    [provider]  fluticasone (FLONASE) 50 MCG/ACT nasal spray Place 2 sprays into both nostrils daily. Patient not taking: Reported on 10/31/2018 04/13/16   Lutricia Feiloemer, William P, PA-C  ibuprofen (ADVIL,MOTRIN) 800 MG tablet Take 1 tablet (800 mg total) by mouth 3 (three) times daily. Patient not taking: Reported on 10/31/2018 04/21/16   Domenick GongMortenson, Ashley, MD    Allergies Patient has no known allergies.  Family History  Problem Relation Age of Onset  . Drug abuse Other        Parent, grandparent  . Arthritis Other   . Heart disease Other        Grandparent  . Sudden death Other        Grandparent    Social History Social History   Tobacco Use  . Smoking status: Current Every Day Smoker    Packs/day: 1.00    Types: Cigarettes  . Smokeless tobacco: Never Used  Substance Use Topics  .  Alcohol use: Yes    Alcohol/week: 12.0 standard drinks    Types: 12 Standard drinks or equivalent per week    Comment: occasional  . Drug use: Yes    Types: Marijuana    Review of Systems Level 5 caveat: Unable to obtain review of systems due to altered mental status  ____________________________________________   PHYSICAL EXAM:  VITAL SIGNS: ED Triage Vitals  Enc Vitals Group     BP 10/31/18 1404 132/90     Pulse Rate 10/31/18 1404 60     Resp 10/31/18 1404 18     Temp 10/31/18 1404 98.8 F (37.1 C)      Temp src --      SpO2 10/31/18 1404 96 %     Weight 10/31/18 1405 145 lb (65.8 kg)     Height 10/31/18 1506 6' (1.829 m)     Head Circumference --      Peak Flow --      Pain Score 10/31/18 1405 0     Pain Loc --      Pain Edu? --      Excl. in [place]? --     Constitutional: Alert, somewhat tired appearing but responding to questions immediately.  Disoriented. Eyes: Conjunctivae are normal.  EOMI.  PERRLA. Head: Atraumatic. Nose: No congestion/rhinnorhea. Mouth/Throat: Mucous membranes are moist.   Neck: Normal range of motion.  No crepitus or palpable subcutaneous air. Cardiovascular: Normal rate, regular rhythm. Grossly normal heart sounds.  Good peripheral circulation. Respiratory: Normal respiratory effort.  No retractions. Lungs CTAB. Gastrointestinal: Soft and nontender. No distention.  Genitourinary: No flank tenderness. Musculoskeletal: No lower extremity edema.  Extremities warm and well perfused.  Neurologic:  Normal speech and language.  Motor intact in all extremities.  No ataxia on finger-to-nose. Skin:  Skin is warm and dry. No rash noted. Psychiatric: Somewhat tangential speech and nonsensical answers.  ____________________________________________   LABS (all labs ordered are listed, but only abnormal results are displayed)  Labs Reviewed  COMPREHENSIVE METABOLIC PANEL - Abnormal; Notable for the following components:      Result Value   Potassium 3.0 (*)    Glucose, Bld 137 (*)    BUN 22 (*)    Creatinine, Ser 0.48 (*)    AST 14 (*)    All other components within normal limits  CBC - Abnormal; Notable for the following components:   WBC 12.8 (*)    All other components within normal limits  URINALYSIS, COMPLETE (UACMP) WITH MICROSCOPIC - Abnormal; Notable for the following components:   Color, Urine YELLOW (*)    APPearance HAZY (*)    Specific Gravity, Urine 1.031 (*)    Ketones, ur 80 (*)    Bacteria, UA RARE (*)    All other components within normal  limits  URINE DRUG SCREEN, QUALITATIVE ([facility] ONLY) - Abnormal; Notable for the following components:   Cannabinoid 50 Ng, Ur [place] POSITIVE (*)    Benzodiazepine, Ur Scrn POSITIVE (*)    All other components within normal limits  ETHANOL  LIPASE, BLOOD  LACTIC ACID, PLASMA  AMMONIA   ____________________________________________  EKG   ____________________________________________  RADIOLOGY  CT head: No ICH or other acute abnormality CXR: Improved pneumomediastinum  ____________________________________________   PROCEDURES  Procedure(s) performed: No  Procedures  Critical Care performed: No ____________________________________________   INITIAL IMPRESSION / ASSESSMENT AND PLAN / ED COURSE  Pertinent labs & imaging results that were available during my care of the patient were reviewed by me  and considered in my medical decision making (see chart for details).  23 year old male with PMH as noted above presents with altered mental status over the last several days.  I reviewed the past medical records in Epic and Care Everywhere and confirmed that the patient was initially seen in the ED here 2 days ago with nausea and vomiting.  He apparently was already confused at that time and had difficulty understanding instructions.  ED work-up revealed pneumomediastinum with concern for possible Boerhaave syndrome, and the patient was transferred [facility].  He had a barium swallow which showed no extravasation and apparently was stable and discharged the next morning.  On exam today, the vital signs are normal.  The patient is lying in the bed and comfortable appearing.  He is alert and answering all my questions immediately, however he does seem disoriented and most of his answers do not make sense.  He talks about disagreement with his grandmother when I asked him why he was here.  When I asked about whether he works, he stated he was too young and then told me  that he was 14 although he does know his correct birthdate.  He states he does not know the month or year, his address, or where he is now although he does not seem to be attempting to actively think of these things.  Otherwise his exam is unremarkable.  Abdomen is soft and nontender.  I do not feel any crepitus in the chest or neck.  His oropharynx is clear.  Neurologic exam is nonfocal and he has no ataxia.  Overall the etiology of his altered mental status is unclear.  Initial labs reveal no significant acute abnormalities.  I would suspect that if he was altered related to pneumomediastinum, esophageal perforation, or mediastinitis that he would appear septic and acutely ill which he does not.  His WBC count has come down and his labs are otherwise reassuring.  There is no evidence of other infectious cause such as encephalitis.  UDS is positive for cannabinoids and benzodiazepines.  The patient does have a history of marijuana use and may have used Xanax, however the patient is not sleepy or sedated his current mental status is not really consistent with benzodiazepine intoxication.  He also may be positive due to sedation he was given during his hospital stay over the last 2 days.  Differential certainly includes primary psychiatric etiology such as acute psychosis especially given his very flat affect and peculiar responses.  I will add on some additional work-up including CT head, lactic acid, ammonia, and ethanol.  If the remainder of the work-up is negative then the patient will need psychiatric evaluation.  ----------------------------------------- 6:04 PM on 10/31/2018 -----------------------------------------  The additional labs are all within normal limits.  CT head shows no acute intracranial findings on the chest x-ray and CT both show improvement in the finding of pneumomediastinum seen 2 days ago.  The patient's vital signs remain stable.  At this time, the patient is medically  cleared and given the negative work-up I am concerned for new onset of psychosis.  I have placed the patient under involuntary commitment due to concern for acute danger to self, and I have ordered psychiatry consult.  Patient will be moved over to the A side of the ED under the care of Dr. Cyril LoosenKinner pending psychiatric evaluation. ____________________________________________   FINAL CLINICAL IMPRESSION(S) / ED DIAGNOSES  Final diagnoses:  Altered mental status, unspecified altered mental status type  NEW MEDICATIONS STARTED DURING THIS VISIT:  New Prescriptions   No medications on file     Note:  This document was prepared using Dragon voice recognition software and may include unintentional dictation errors.    Dionne BucySiadecki, Shelsey Rieth, MD 10/31/18 2328

## 2018-10-31 NOTE — ED Notes (Signed)
Report on Situation, Background, Assessment, and Recommendations received from day shift RN. Patient alert and in no visible distress, but overall presentation is bizarre and speech is disorganized. Patient made aware of 1:1 and security cameras for their safety. Patient instructed to come to me with needs or concerns.

## 2018-10-31 NOTE — ED Notes (Signed)
Pt. Becoming increasingly agitated and more disorganized at and around this time. Will review presentation with ED provider and receive orders.

## 2018-10-31 NOTE — ED Notes (Signed)
Patient at and around this time is visibly agitated, aggressive, yelling, and posturing at staff and this Probation officer. Pt. Is visibly disorganized in his thought process. Pt. Presentation reviewed with ED provider, orders received for IM medications. Pt. Given IM medications with show of support with no complications. Will continue to monitor closely for safety.

## 2018-10-31 NOTE — ED Provider Notes (Signed)
Patient is becoming very agitated and aggressive screening at this time have ordered 20 mg of IM Geodon for patient staff safety   Lavonia Drafts, MD 10/31/18 2006

## 2018-10-31 NOTE — Progress Notes (Signed)
Patient given IM medications for comfort and safety of the patient with the help of security and law enforcement for show of support. No complications during the administration process.

## 2018-10-31 NOTE — ED Notes (Signed)
Pt was given a sandwich tray and water. Pt got up to go to the bathroom and went back to bed.

## 2018-10-31 NOTE — ED Notes (Signed)
Pt transferred from ED4 with altered mental status. According to his mother, he was fine until he recently began vomiting violently and was transferred to [facility]. Mother states he began speaking strangely to her on the phone while there. He was released yesterday and has gotten progressively more confused and acting more strangely. Pt speaks to people who aren't there and mimics actions of smoking. Pt cooperative and calm. This nurse gave him sandwich tray and sprite per his request; has not consumed either.

## 2018-10-31 NOTE — ED Notes (Signed)
Pt periodically walks to the doorway of his room and states that he is very tired and would like medicine that would give him energy, then gets back in bed.

## 2018-10-31 NOTE — ED Notes (Signed)
Pt came out of his room to wash his hands, and became very agitated when he couldn't find the bathroom. Pt then laid down on bed [address]. Security officer told him he had to go back to his own room, pt said no and yelled at the officer to "bring him his clothes or he would snap."

## 2018-10-31 NOTE — ED Notes (Addendum)
Pt's mother states pt has altered mental status that started Wednesday but has worsened today. Pt mother states he is "talking to walls, answering questions that nobody is asking". Pt send in ED Friday for nausea and vomiting, transferred to [facility] and discharged with dx of mediastinal emphysema. Denies N/V today.   Pt is disoriented to time, place and situation

## 2018-10-31 NOTE — ED Notes (Signed)
Patient seen by [facility] psychiatrist.

## 2018-10-31 NOTE — Progress Notes (Signed)
Call received from psychiatry team for report on patient. Report given to the best of this writer's ability, given the information available in the chart.

## 2018-10-31 NOTE — ED Notes (Signed)
Re-assessment on patient after IM medications. Pt. Is visibly calmer in his room, but observed to be talking to himself, responding to internal stimuli. Will continue to monitor closely for safety.

## 2018-10-31 NOTE — Progress Notes (Signed)
Patient observably calmer in his room, but the patient is still at times having small mild bursts of agitation verbally to himself.

## 2018-10-31 NOTE — ED Triage Notes (Signed)
Pt to ED with AMS. Pt recently seen here for emesis and transferred to [facility]. Pt discharged from [facility] yesterday. Pt now presents with AMS. Per mother pt is not eating and not at baseline.

## 2018-11-01 DIAGNOSIS — F13939 Sedative, hypnotic or anxiolytic use, unspecified with withdrawal, unspecified: Secondary | ICD-10-CM | POA: Diagnosis present

## 2018-11-01 DIAGNOSIS — F13239 Sedative, hypnotic or anxiolytic dependence with withdrawal, unspecified: Secondary | ICD-10-CM

## 2018-11-01 DIAGNOSIS — F10231 Alcohol dependence with withdrawal delirium: Secondary | ICD-10-CM

## 2018-11-01 DIAGNOSIS — F10931 Alcohol use, unspecified with withdrawal delirium: Secondary | ICD-10-CM

## 2018-11-01 LAB — BASIC METABOLIC PANEL
Anion gap: 9 (ref 5–15)
BUN: 21 mg/dL — ABNORMAL HIGH (ref 6–20)
CO2: 27 mmol/L (ref 22–32)
Calcium: 9.1 mg/dL (ref 8.9–10.3)
Chloride: 104 mmol/L (ref 98–111)
Creatinine, Ser: 0.71 mg/dL (ref 0.61–1.24)
GFR calc Af Amer: >60 mL/min (ref >60)
GFR calc non Af Amer: >60 mL/min (ref >60)
Glucose, Bld: 112 mg/dL — ABNORMAL HIGH (ref 70–99)
Potassium: 3.1 mmol/L — ABNORMAL LOW (ref 3.5–5.1)
Sodium: 140 mmol/L (ref 135–145)

## 2018-11-01 LAB — MAGNESIUM: Magnesium: 2.3 mg/dL (ref 1.7–2.4)

## 2018-11-01 LAB — GLUCOSE, CAPILLARY: Glucose-Capillary: 144 mg/dL — ABNORMAL HIGH (ref 70–99)

## 2018-11-01 LAB — MRSA PCR SCREENING: MRSA by PCR: NEGATIVE

## 2018-11-01 LAB — PHOSPHORUS: Phosphorus: 3.9 mg/dL (ref 2.5–4.6)

## 2018-11-01 LAB — TSH: TSH: 1.569 u[IU]/mL (ref 0.350–4.500)

## 2018-11-01 MED ORDER — HALOPERIDOL LACTATE 2 MG/ML PO CONC
5.0000 mg | Freq: Four times a day (QID) | ORAL | Status: DC | PRN
  Filled 2018-11-01: qty 2.5

## 2018-11-01 MED ORDER — POTASSIUM CHLORIDE IN NACL 40-0.9 MEQ/L-% IV SOLN
INTRAVENOUS | Status: DC
  Administered 2018-11-01: 100 mL/h via INTRAVENOUS
  Filled 2018-11-01 (×3): qty 1000

## 2018-11-01 MED ORDER — ZIPRASIDONE MESYLATE 20 MG IM SOLR
10.0000 mg | Freq: Once | INTRAMUSCULAR | Status: AC
  Administered 2018-11-01: 10 mg via INTRAMUSCULAR
  Filled 2018-11-01: qty 20

## 2018-11-01 MED ORDER — FAMOTIDINE 20 MG PO TABS: 20.0000 mg | ORAL_TABLET | Freq: Two times a day (BID) | ORAL | Status: DC

## 2018-11-01 MED ORDER — ENOXAPARIN SODIUM 40 MG/0.4ML ~~LOC~~ SOLN
40.0000 mg | SUBCUTANEOUS | Status: DC
  Administered 2018-11-01 – 2018-11-03 (×4): 40 mg via SUBCUTANEOUS
  Filled 2018-11-01 (×3): qty 0.4

## 2018-11-01 MED ORDER — LORAZEPAM 2 MG/ML IJ SOLN
0.0000 mg | INTRAMUSCULAR | Status: DC
  Administered 2018-11-01: 2 mg via INTRAVENOUS
  Filled 2018-11-01: qty 1

## 2018-11-01 MED ORDER — LORAZEPAM 1 MG PO TABS
1.0000 mg | ORAL_TABLET | ORAL | Status: DC
  Administered 2018-11-01: 1 mg via ORAL
  Filled 2018-11-01: qty 1

## 2018-11-01 MED ORDER — LORAZEPAM 2 MG PO TABS
0.0000 mg | ORAL_TABLET | Freq: Four times a day (QID) | ORAL | Status: DC
  Administered 2018-11-01: 2 mg via ORAL
  Filled 2018-11-01: qty 1

## 2018-11-01 MED ORDER — DIPHENHYDRAMINE HCL 50 MG/ML IJ SOLN
50.0000 mg | Freq: Four times a day (QID) | INTRAMUSCULAR | Status: DC
  Administered 2018-11-01 – 2018-11-02 (×2): 50 mg via INTRAVENOUS
  Filled 2018-11-01 (×2): qty 1

## 2018-11-01 MED ORDER — DEXMEDETOMIDINE HCL IN NACL 200 MCG/50ML IV SOLN
0.2000 ug/kg/h | INTRAVENOUS | Status: DC
  Filled 2018-11-01: qty 50

## 2018-11-01 MED ORDER — THIAMINE HCL 100 MG/ML IJ SOLN: 100.0000 mg | Freq: Every day | INTRAMUSCULAR | Status: DC

## 2018-11-01 MED ORDER — ONDANSETRON HCL 4 MG/2ML IJ SOLN: 4.0000 mg | Freq: Four times a day (QID) | INTRAMUSCULAR | Status: DC | PRN

## 2018-11-01 MED ORDER — DEXMEDETOMIDINE HCL IN NACL 200 MCG/50ML IV SOLN
0.4000 ug/kg/h | INTRAVENOUS | Status: DC
  Filled 2018-11-01: qty 50

## 2018-11-01 MED ORDER — LORAZEPAM 2 MG/ML IJ SOLN
2.0000 mg | Freq: Once | INTRAMUSCULAR | Status: DC
  Filled 2018-11-01: qty 1

## 2018-11-01 MED ORDER — POTASSIUM CHLORIDE 10 MEQ/100ML IV SOLN
10.0000 meq | INTRAVENOUS | Status: AC
  Administered 2018-11-01 (×5): 10 meq via INTRAVENOUS
  Filled 2018-11-01 (×5): qty 100

## 2018-11-01 MED ORDER — LORATADINE 10 MG PO TABS
10.0000 mg | ORAL_TABLET | Freq: Every day | ORAL | Status: DC
  Administered 2018-11-01: 10 mg via ORAL
  Filled 2018-11-01: qty 1

## 2018-11-01 MED ORDER — ADULT MULTIVITAMIN W/MINERALS CH: 1.0000 | ORAL_TABLET | Freq: Every day | ORAL | Status: DC

## 2018-11-01 MED ORDER — LORAZEPAM 2 MG/ML IJ SOLN: 0.0000 mg | Freq: Four times a day (QID) | INTRAMUSCULAR | Status: DC

## 2018-11-01 MED ORDER — HALOPERIDOL LACTATE 5 MG/ML IJ SOLN
10.0000 mg | Freq: Once | INTRAMUSCULAR | Status: AC
  Administered 2018-11-01: 10 mg via INTRAVENOUS

## 2018-11-01 MED ORDER — FAMOTIDINE IN NACL 20-0.9 MG/50ML-% IV SOLN
20.0000 mg | Freq: Two times a day (BID) | INTRAVENOUS | Status: DC
  Administered 2018-11-02 (×2): 20 mg via INTRAVENOUS
  Filled 2018-11-01 (×2): qty 50

## 2018-11-01 MED ORDER — LORAZEPAM 2 MG/ML IJ SOLN
2.0000 mg | Freq: Once | INTRAMUSCULAR | Status: AC
  Filled 2018-11-01: qty 1

## 2018-11-01 MED ORDER — CHLORHEXIDINE GLUCONATE CLOTH 2 % EX PADS
6.0000 | MEDICATED_PAD | Freq: Every day | CUTANEOUS | Status: DC
  Administered 2018-11-02 – 2018-11-03 (×2): 6 via TOPICAL

## 2018-11-01 MED ORDER — HALOPERIDOL LACTATE 5 MG/ML IJ SOLN: 5.0000 mg | Freq: Four times a day (QID) | INTRAMUSCULAR | Status: DC | PRN

## 2018-11-01 MED ORDER — ZIPRASIDONE MESYLATE 20 MG IM SOLR
10.0000 mg | Freq: Once | INTRAMUSCULAR | Status: AC
  Administered 2018-11-01: 02:00:00 10 mg via INTRAMUSCULAR
  Filled 2018-11-01: qty 20

## 2018-11-01 MED ORDER — DIPHENHYDRAMINE HCL 50 MG/ML IJ SOLN
50.0000 mg | Freq: Once | INTRAMUSCULAR | Status: AC
  Administered 2018-11-01: 50 mg via INTRAVENOUS

## 2018-11-01 MED ORDER — LORAZEPAM 2 MG/ML IJ SOLN
2.0000 mg | INTRAMUSCULAR | Status: DC | PRN
  Administered 2018-11-01 (×2): 3 mg via INTRAVENOUS
  Filled 2018-11-01: qty 2

## 2018-11-01 MED ORDER — ONDANSETRON HCL 4 MG PO TABS: 4.0000 mg | ORAL_TABLET | Freq: Four times a day (QID) | ORAL | Status: DC | PRN

## 2018-11-01 MED ORDER — FOLIC ACID 1 MG PO TABS
1.0000 mg | ORAL_TABLET | Freq: Every day | ORAL | Status: DC
  Administered 2018-11-01: 1 mg via ORAL
  Filled 2018-11-01: qty 1

## 2018-11-01 MED ORDER — HYDROMORPHONE HCL 1 MG/ML IJ SOLN
2.0000 mg | Freq: Four times a day (QID) | INTRAMUSCULAR | Status: DC
  Administered 2018-11-01 – 2018-11-02 (×2): 2 mg via INTRAVENOUS
  Filled 2018-11-01 (×2): qty 2

## 2018-11-01 MED ORDER — HYDROMORPHONE HCL 1 MG/ML IJ SOLN
INTRAMUSCULAR | Status: AC
  Filled 2018-11-01: qty 2

## 2018-11-01 MED ORDER — ACETAMINOPHEN 325 MG PO TABS: 650.0000 mg | ORAL_TABLET | Freq: Four times a day (QID) | ORAL | Status: DC | PRN

## 2018-11-01 MED ORDER — THIAMINE HCL 100 MG/ML IJ SOLN
INTRAVENOUS | Status: DC
  Administered 2018-11-01: 19:00:00 via INTRAVENOUS
  Filled 2018-11-01 (×3): qty 1000

## 2018-11-01 MED ORDER — LORAZEPAM 2 MG/ML IJ SOLN
2.0000 mg | Freq: Once | INTRAMUSCULAR | Status: AC
  Administered 2018-11-01: 2 mg via INTRAMUSCULAR
  Filled 2018-11-01: qty 1

## 2018-11-01 MED ORDER — LORAZEPAM 2 MG PO TABS: 0.0000 mg | ORAL_TABLET | Freq: Two times a day (BID) | ORAL | Status: DC

## 2018-11-01 MED ORDER — DOCUSATE SODIUM 100 MG PO CAPS
100.0000 mg | ORAL_CAPSULE | Freq: Two times a day (BID) | ORAL | Status: DC
  Administered 2018-11-01: 100 mg via ORAL
  Filled 2018-11-01: qty 1

## 2018-11-01 MED ORDER — LORAZEPAM 2 MG/ML IJ SOLN: 0.0000 mg | Freq: Two times a day (BID) | INTRAMUSCULAR | Status: DC

## 2018-11-01 MED ORDER — LORAZEPAM 2 MG/ML IJ SOLN: 1.0000 mg | INTRAMUSCULAR | Status: DC

## 2018-11-01 MED ORDER — PANTOPRAZOLE SODIUM 40 MG PO TBEC
40.0000 mg | DELAYED_RELEASE_TABLET | Freq: Every day | ORAL | Status: DC
  Administered 2018-11-01: 09:00:00 40 mg via ORAL
  Filled 2018-11-01: qty 1

## 2018-11-01 MED ORDER — VITAMIN B-1 100 MG PO TABS: 100.0000 mg | ORAL_TABLET | Freq: Every day | ORAL | Status: DC

## 2018-11-01 MED ORDER — HALOPERIDOL LACTATE 5 MG/ML IJ SOLN
1.0000 mg | Freq: Four times a day (QID) | INTRAMUSCULAR | Status: DC | PRN
  Administered 2018-11-01: 11:00:00 1 mg via INTRAVENOUS
  Filled 2018-11-01 (×2): qty 1

## 2018-11-01 MED ORDER — ALBUTEROL SULFATE (2.5 MG/3ML) 0.083% IN NEBU: 2.5000 mg | INHALATION_SOLUTION | RESPIRATORY_TRACT | Status: DC | PRN

## 2018-11-01 MED ORDER — HYDROMORPHONE HCL 1 MG/ML IJ SOLN
2.0000 mg | Freq: Once | INTRAMUSCULAR | Status: AC
  Administered 2018-11-01: 2 mg via INTRAVENOUS

## 2018-11-01 MED ORDER — VITAMIN B-1 100 MG PO TABS
100.0000 mg | ORAL_TABLET | Freq: Every day | ORAL | Status: DC
  Administered 2018-11-01: 10:00:00 100 mg via ORAL
  Filled 2018-11-01: qty 1

## 2018-11-01 MED ORDER — NICOTINE 14 MG/24HR TD PT24
14.0000 mg | MEDICATED_PATCH | Freq: Every day | TRANSDERMAL | Status: DC
  Administered 2018-11-01 – 2018-11-03 (×3): 14 mg via TRANSDERMAL
  Filled 2018-11-01 (×3): qty 1

## 2018-11-01 MED ORDER — PNEUMOCOCCAL VAC POLYVALENT 25 MCG/0.5ML IJ INJ
0.5000 mL | INJECTION | INTRAMUSCULAR | Status: AC
  Administered 2018-11-02: 09:00:00 0.5 mL via INTRAMUSCULAR
  Filled 2018-11-01: qty 0.5

## 2018-11-01 MED ORDER — ACETAMINOPHEN 650 MG RE SUPP: 650.0000 mg | Freq: Four times a day (QID) | RECTAL | Status: DC | PRN

## 2018-11-01 MED ORDER — DEXMEDETOMIDINE HCL IN NACL 400 MCG/100ML IV SOLN
0.0000 ug/kg/h | INTRAVENOUS | Status: DC
  Administered 2018-11-01: 21:00:00 1.2 ug/kg/h via INTRAVENOUS
  Administered 2018-11-01: 0.2 ug/kg/h via INTRAVENOUS
  Administered 2018-11-02: 1.2 ug/kg/h via INTRAVENOUS
  Filled 2018-11-01 (×3): qty 100

## 2018-11-01 NOTE — ED Provider Notes (Addendum)
Discussed case with Dr. Marcille Blanco.  The patient does not presently have evidence of acute mediastinitis, he was excluded for this at [facility] I spoke with Dr. Waldemar Dickens about his work-up there.  [facility] reports the patient showed significant improvement with treatment alcohol or benzodiazepine withdrawa.  His symptomatology is to me suggest that he likely still has ongoing delirium tremens/withdrawal.  He is responding well to benzodiazepine dosing here and has been placed on withdrawal protocol now.  Chest x-ray here shows no pneumomediastinum, urinalysis reviewed.  [facility] reports ruled out for infection during his hospital stay there as well  Related admit to internal medicine services here for further work-up.  And due to the patient's involuntary commitment, intermittent agitation and delirium tremens I do feel that risk of transport would outweigh any potential benefit to going to [facility], especially having discussed the case and prior work-up with Dr. Waldemar Dickens.   Delman Kitten, MD 11/01/18 0359  COVID negative 10/29/18    Delman Kitten, MD 11/01/18 0400

## 2018-11-01 NOTE — Progress Notes (Signed)
Pt not able to sit still and will not allow blood pressure to be taken at this time.

## 2018-11-01 NOTE — H&P (Addendum)
Ryan Booth is an 23 y.o. male.   Chief Complaint: Altered mental status HPI: The patient with past medical history of recent admission to rule out sepsis and mediastinitis presents to the emergency department due to agitation.  The patient was very aggressive with nursing staff and security personnel.  He was given antipsychotic medication as well as Ativan which eventually calmed him down.  Notably, the patient was transferred to [facility] for evaluation of potential mediastinitis 3 days ago and discharged following rule out of infection as well as CIWA protocol for potential benzodiazepine withdrawal.  Tonight the patient returns with fever as well as tachypnea and leukocytosis.  However, with no discernible source of infection the emergency department as well as hospital staff agreed that his symptoms were again due to withdrawal which prompted admission.  Past Medical History:  Diagnosis Date  . Alcohol problem drinking   . Arthritis   . Asthma   . GERD (gastroesophageal reflux disease)     History reviewed. No pertinent surgical history.  Patient cannot contribute to history  Family History  Problem Relation Age of Onset  . Drug abuse Other        Parent, grandparent  . Arthritis Other   . Heart disease Other        Grandparent  . Sudden death Other        Grandparent   Social History:  reports that he has been smoking cigarettes. He has been smoking about 1.00 pack per day. He has never used smokeless tobacco. He reports current alcohol use of about 12.0 standard drinks of alcohol per week. He reports current drug use. Drug: Marijuana.  Allergies: No Known Allergies  Medications Prior to Admission  Medication Sig Dispense Refill  . omeprazole (PRILOSEC) 20 MG capsule Take 20 mg by mouth daily.    Marland Kitchen albuterol (PROVENTIL HFA;VENTOLIN HFA) 108 (90 Base) MCG/ACT inhaler Inhale 1-2 puffs into the lungs every 6 (six) hours as needed for wheezing or shortness of breath. 1  Inhaler 1  . fexofenadine (ALLEGRA) 180 MG tablet Take 180 mg by mouth daily.    . fluticasone (FLONASE) 50 MCG/ACT nasal spray Place 2 sprays into both nostrils daily. (Patient not taking: Reported on 10/31/2018) 16 g 0  . ibuprofen (ADVIL,MOTRIN) 800 MG tablet Take 1 tablet (800 mg total) by mouth 3 (three) times daily. (Patient not taking: Reported on 10/31/2018) 30 tablet 0    Results for orders placed or performed during the hospital encounter of 10/31/18 (from the past 48 hour(s))  Comprehensive metabolic panel     Status: Abnormal   Collection Time: 10/31/18  2:13 PM  Result Value Ref Range   Sodium 136 135 - 145 mmol/L   Potassium 3.0 (L) 3.5 - 5.1 mmol/L   Chloride 100 98 - 111 mmol/L   CO2 26 22 - 32 mmol/L   Glucose, Bld 137 (H) 70 - 99 mg/dL   BUN 22 (H) 6 - 20 mg/dL   Creatinine, Ser 0.48 (L) 0.61 - 1.24 mg/dL   Calcium 9.3 8.9 - 10.3 mg/dL   Total Protein 7.2 6.5 - 8.1 g/dL   Albumin 4.4 3.5 - 5.0 g/dL   AST 14 (L) 15 - 41 U/L   ALT 18 0 - 44 U/L   Alkaline Phosphatase 67 38 - 126 U/L   Total Bilirubin 1.0 0.3 - 1.2 mg/dL   GFR calc non Af Amer >60 >60 mL/min   GFR calc Af Amer >60 >60 mL/min  Anion gap 10 5 - 15    Comment: Performed at [facility], [address] [place], [place] [address]  CBC     Status: Abnormal   Collection Time: 10/31/18  2:13 PM  Result Value Ref Range   WBC 12.8 (H) 4.0 - 10.5 K/uL   RBC 5.25 4.22 - 5.81 MIL/uL   Hemoglobin 15.3 13.0 - 17.0 g/dL   HCT 91.443.3 78.239.0 - 95.652.0 %   MCV 82.5 80.0 - 100.0 fL   MCH 29.1 26.0 - 34.0 pg   MCHC 35.3 30.0 - 36.0 g/dL   RDW 21.311.9 08.611.5 - 57.815.5 %   Platelets 275 150 - 400 K/uL   nRBC 0.0 0.0 - 0.2 %    Comment: Performed at [facility], [address] [place], [place] [address]  Urinalysis, Complete w Microscopic     Status: Abnormal   Collection Time: 10/31/18  2:16 PM  Result Value Ref Range   Color, Urine YELLOW (A) YELLOW   APPearance HAZY (A) CLEAR   Specific Gravity, Urine  1.031 (H) 1.005 - 1.030   pH 6.0 5.0 - 8.0   Glucose, UA NEGATIVE NEGATIVE mg/dL   Hgb urine dipstick NEGATIVE NEGATIVE   Bilirubin Urine NEGATIVE NEGATIVE   Ketones, ur 80 (A) NEGATIVE mg/dL   Protein, ur NEGATIVE NEGATIVE mg/dL   Nitrite NEGATIVE NEGATIVE   Leukocytes,Ua NEGATIVE NEGATIVE   RBC / HPF 0-5 0 - 5 RBC/hpf   WBC, UA 0-5 0 - 5 WBC/hpf   Bacteria, UA RARE (A) NONE SEEN   Squamous Epithelial / LPF NONE SEEN 0 - 5   Mucus PRESENT     Comment: Performed at [facility], [address] [place], [place] [address]  Urine Drug Screen, Qualitative ([facility] only)     Status: Abnormal   Collection Time: 10/31/18  2:16 PM  Result Value Ref Range   Tricyclic, Ur Screen NONE DETECTED NONE DETECTED   Amphetamines, Ur Screen NONE DETECTED NONE DETECTED   MDMA (Ecstasy)Ur Screen NONE DETECTED NONE DETECTED   Cocaine Metabolite,Ur [place] NONE DETECTED NONE DETECTED   Opiate, Ur Screen NONE DETECTED NONE DETECTED   Phencyclidine (PCP) Ur S NONE DETECTED NONE DETECTED   Cannabinoid 50 Ng, Ur [place] POSITIVE (A) NONE DETECTED   Barbiturates, Ur Screen NONE DETECTED NONE DETECTED   Benzodiazepine, Ur Scrn POSITIVE (A) NONE DETECTED   Methadone Scn, Ur NONE DETECTED NONE DETECTED    Comment: (NOTE) Tricyclics + metabolites, urine    Cutoff 1000 ng/mL Amphetamines + metabolites, urine  Cutoff 1000 ng/mL MDMA (Ecstasy), urine              Cutoff 500 ng/mL Cocaine Metabolite, urine          Cutoff 300 ng/mL Opiate + metabolites, urine        Cutoff 300 ng/mL Phencyclidine (PCP), urine         Cutoff 25 ng/mL Cannabinoid, urine                 Cutoff 50 ng/mL Barbiturates + metabolites, urine  Cutoff 200 ng/mL Benzodiazepine, urine              Cutoff 200 ng/mL Methadone, urine                   Cutoff 300 ng/mL The urine drug screen provides only a preliminary, unconfirmed analytical test result and should not be used for non-medical purposes. Clinical consideration and professional  judgment should  be applied to any positive drug screen result due to possible interfering substances. A more specific alternate chemical method must be used in order to obtain a confirmed analytical result. Gas chromatography / mass spectrometry (GC/MS) is the preferred confirmat ory method. Performed at [facility], [address] [place], [place] [address]   Ethanol     Status: None   Collection Time: 10/31/18  3:34 PM  Result Value Ref Range   Alcohol, Ethyl (B) <10 <10 mg/dL    Comment: (NOTE) Lowest detectable limit for serum alcohol is 10 mg/dL. For medical purposes only. Performed at [facility], [address] [place], [place] [address]   Lipase, blood     Status: None   Collection Time: 10/31/18  3:34 PM  Result Value Ref Range   Lipase 30 11 - 51 U/L    Comment: Performed at [facility], [address] [place], [place] [address]  Lactic acid, plasma     Status: None   Collection Time: 10/31/18  3:34 PM  Result Value Ref Range   Lactic Acid, Venous 1.1 0.5 - 1.9 mmol/L    Comment: Performed at [facility], [address] [place], [place] [address]  Ammonia     Status: None   Collection Time: 10/31/18  3:34 PM  Result Value Ref Range   Ammonia 22 9 - 35 umol/L    Comment: Performed at [facility], [address] [place], [place] [address]  TSH     Status: None   Collection Time: 11/01/18  5:19 AM  Result Value Ref Range   TSH 1.569 0.350 - 4.500 uIU/mL    Comment: Performed by a 3rd Generation assay with a functional sensitivity of <=0.01 uIU/mL. Performed at [facility], [address] [place], [place] [address]    Ct Head Wo Contrast  Result Date: 10/31/2018 CLINICAL DATA:  Altered level of consciousness EXAM: CT HEAD WITHOUT CONTRAST TECHNIQUE: Contiguous axial images were obtained from the base of the skull through the vertex without intravenous contrast. COMPARISON:  10/29/2018 FINDINGS:  Brain: No acute intracranial abnormality. Specifically, no hemorrhage, hydrocephalus, mass lesion, acute infarction, or significant intracranial injury. Vascular: No hyperdense vessel or unexpected calcification. Skull: No acute calvarial abnormality. Sinuses/Orbits: Visualized paranasal sinuses and mastoids clear. Orbital soft tissues unremarkable. Other: Small amount of soft tissue gas again noted near the right skull base, decreased since prior study related to prior pneumomediastinum. IMPRESSION: No acute intracranial abnormality. Improving soft tissue gas near the skull base. Electronically Signed   By: Charlett NoseKevin  Dover M.D.   On: 10/31/2018 16:07   Dg Chest Portable 1 View  Result Date: 10/31/2018 CLINICAL DATA:  Pneumomediastinum EXAM: PORTABLE CHEST 1 VIEW COMPARISON:  10/29/2018 FINDINGS: Previously seen pneumomediastinum by plain film and CT not definitively seen on today's study. Pneumomediastinum was easier to visualize on prior CT. No pneumothorax. Lungs are clear. Heart is normal size. No effusions. IMPRESSION: No visible pneumomediastinum or pneumothorax. Electronically Signed   By: Charlett NoseKevin  Dover M.D.   On: 10/31/2018 15:53    ROS  Blood pressure (!) 136/94, pulse 92, temperature 99.4 F (37.4 C), temperature source Oral, resp. rate 19, height 6' (1.829 m), weight 72.6 kg, SpO2 99 %. Physical Exam  Vitals reviewed. Constitutional: He is oriented to person, place, and time. He appears well-developed and well-nourished. No distress.  HENT:  Head: Normocephalic and atraumatic.  Mouth/Throat: Oropharynx is clear and moist.  Eyes: Pupils are equal, round, and reactive  to light. Conjunctivae and EOM are normal. No scleral icterus.  Neck: Normal range of motion. Neck supple. No JVD present. No tracheal deviation present. No thyromegaly present.  Cardiovascular: Normal rate and regular rhythm. Exam reveals no gallop and no friction rub.  No murmur heard. Respiratory: Effort normal and breath  sounds normal. No respiratory distress.  GI: Soft. Bowel sounds are normal. He exhibits no distension. There is no abdominal tenderness.  Genitourinary:    Genitourinary Comments: Deferred   Musculoskeletal: Normal range of motion.        General: No edema.  Lymphadenopathy:    He has no cervical adenopathy.  Neurological: He is alert and oriented to person, place, and time. No cranial nerve deficit.  Skin: Skin is warm and dry. No rash noted. No erythema.  Psychiatric: He has a normal mood and affect. His behavior is normal. Judgment and thought content normal.     Assessment/Plan This is a 23 year old male admitted for benzodiazepine withdrawal. 1.  Withdrawal symptoms: Initiate CIWA protocol.  Seizure precautions.  Hydrate with intravenous fluid 2.  Hypokalemia: Replete potassium.  Monitor telemetry 3.  SIRS: No indication of infectious source.  Symptoms secondary to withdrawal.  Continue to monitor 4.  Asthma: Controlled; albuterol as needed 5.  DVT prophylaxis: Lovenox 6.  GI prophylaxis: None Patient is a full code.  Time spent on admission orders and patient care approximately 45 minutes  Arnaldo Nataliamond,  Rachit Grim S, MD 11/01/2018, 8:07 AM

## 2018-11-01 NOTE — Progress Notes (Signed)
The patient is very agitated.  He was given Ativan, Haldol and Geodon without improvement. Vital signs and labs reviewed. Start Precedex and transferred to stepdown unit. I discussed with the RN, his mother and intensivist Dr. Lanney Gins.

## 2018-11-01 NOTE — Consult Note (Addendum)
[facility] Face-to-Face [facility] Consult   Reason for Consult:  Benzodiazepine withdrawal Referring Physician:  EDP Patient Identification: Ryan Booth Miers MRN:  [id number] Principal Diagnosis: Benzodiazepine withdrawal (HCC) Diagnosis:  Principal Problem:   Benzodiazepine withdrawal (HCC)   Total Time spent with patient: 1 hour  Subjective:   Ryan Booth Chow is a 23 y.o. male patient admitted with benzodiazepine withdrawal.  Patient seen and evaluated face-to-face by this provider.  He initially sitting on his bed with slurring speech.  Gave permission to talk to his fiance at his bedside.  Then, he started getting up despite his sitter trying to verbally and physically redirecting him to sit as his IV was pulling.  Multiple needed to redirect.  Geodon given earlier multiple times to no avail.  Transferred to ICU due to agitation for Precedex.  Haldol 10 mg IV ordered and resting quietly later when this provider rechecked his state.    Fiance reports he takes at least a bar of Xanax a day, he states 60 mg.  She also reports taking Percocets at times, unsure of any other drugs.  Long history of substance abuse since the age of 23, adolescent admissions in the past.  Lives with his fiance and their four year old daughter, works outside of the home.    HPI per MD:  23 y.o. male with past medical history of recent admission to rule out sepsis and mediastinitis presents to the emergency department due to agitation.  The patient was very aggressive with nursing staff and security personnel.  He was given antipsychotic medication as well as Ativan which eventually calmed him down.  Notably, the patient was [facility] for evaluation of potential mediastinitis 3 days ago and discharged following rule out of infection as well as CIWA protocol for potential benzodiazepine withdrawal.  Tonight the patient returns with fever as well as tachypnea and leukocytosis.  However, with no  discernible source of infection the emergency department as well as hospital staff agreed that his symptoms were again due to withdrawal which prompted admission.  Past Psychiatric History: depression, anxiety, substance abuse  Risk to Self:  yes Risk to Others:  yes Prior Inpatient Therapy:   adolescent psych Prior Outpatient Therapy:  not currently  Past Medical History:  Past Medical History:  Diagnosis Date  . Alcohol problem drinking   . Arthritis   . Asthma   . GERD (gastroesophageal reflux disease)    History reviewed. No pertinent surgical history. Family History:  Family History  Problem Relation Age of Onset  . Drug abuse Other        Parent, grandparent  . Arthritis Other   . Heart disease Other        Grandparent  . Sudden death Other        Grandparent   Family Psychiatric  History: none Social History:  Social History   Substance and Sexual Activity  Alcohol Use Yes  . Alcohol/week: 12.0 standard drinks  . Types: 12 Standard drinks or equivalent per week   Comment: occasional     Social History   Substance and Sexual Activity  Drug Use Yes  . Types: Marijuana    Social History   Socioeconomic History  . Marital status: Single    Spouse name: Not on file  . Number of children: Not on file  . Years of education: Not on file  . Highest education level: Not on file  Occupational History  . Not on file  Social  Needs  . Financial resource strain: Not on file  . Food insecurity    Worry: Not on file    Inability: Not on file  . Transportation needs    Medical: Not on file    Non-medical: Not on file  Tobacco Use  . Smoking status: Current Every Day Smoker    Packs/day: 1.00    Types: Cigarettes  . Smokeless tobacco: Never Used  Substance and Sexual Activity  . Alcohol use: Yes    Alcohol/week: 12.0 standard drinks    Types: 12 Standard drinks or equivalent per week    Comment: occasional  . Drug use: Yes    Types: Marijuana  . Sexual  activity: Not on file  Lifestyle  . Physical activity    Days per week: Not on file    Minutes per session: Not on file  . Stress: Not on file  Relationships  . Social Musicianconnections    Talks on phone: Not on file    Gets together: Not on file    Attends religious service: Not on file    Active member of club or organization: Not on file    Attends meetings of clubs or organizations: Not on file    Relationship status: Not on file  Other Topics Concern  . Not on file  Social History Narrative   ** Merged History Encounter **       Additional Social History:    Allergies:  No Known Allergies  Labs:  Results for orders placed or performed during the hospital encounter of 10/31/18 (from the past 48 hour(s))  Comprehensive metabolic panel     Status: Abnormal   Collection Time: 10/31/18  2:13 PM  Result Value Ref Range   Sodium 136 135 - 145 mmol/L   Potassium 3.0 (L) 3.5 - 5.1 mmol/L   Chloride 100 98 - 111 mmol/L   CO2 26 22 - 32 mmol/L   Glucose, Bld 137 (H) 70 - 99 mg/dL   BUN 22 (H) 6 - 20 mg/dL   Creatinine, Ser 1.610.48 (L) 0.61 - 1.24 mg/dL   Calcium 9.3 8.9 - 09.610.3 mg/dL   Total Protein 7.2 6.5 - 8.1 g/dL   Albumin 4.4 3.5 - 5.0 g/dL   AST 14 (L) 15 - 41 U/L   ALT 18 0 - 44 U/L   Alkaline Phosphatase 67 38 - 126 U/L   Total Bilirubin 1.0 0.3 - 1.2 mg/dL   GFR calc non Af Amer >60 >60 mL/min   GFR calc Af Amer >60 >60 mL/min   Anion gap 10 5 - 15    Comment: Performed at [facility], [address] [place], [place] [address]  CBC     Status: Abnormal   Collection Time: 10/31/18  2:13 PM  Result Value Ref Range   WBC 12.8 (H) 4.0 - 10.5 K/uL   RBC 5.25 4.22 - 5.81 MIL/uL   Hemoglobin 15.3 13.0 - 17.0 g/dL   HCT 98.143.3 19.139.0 - 47.852.0 %   MCV 82.5 80.0 - 100.0 fL   MCH 29.1 26.0 - 34.0 pg   MCHC 35.3 30.0 - 36.0 g/dL   RDW 29.511.9 62.111.5 - 30.815.5 %   Platelets 275 150 - 400 K/uL   nRBC 0.0 0.0 - 0.2 %    Comment: Performed at [facility], [address] [place], [place] [address]  Urinalysis, Complete w Microscopic     Status: Abnormal   Collection Time: 10/31/18  2:16 PM  Result Value Ref Range   Color, Urine YELLOW (A) YELLOW   APPearance HAZY (A) CLEAR   Specific Gravity, Urine 1.031 (H) 1.005 - 1.030   pH 6.0 5.0 - 8.0   Glucose, UA NEGATIVE NEGATIVE mg/dL   Hgb urine dipstick NEGATIVE NEGATIVE   Bilirubin Urine NEGATIVE NEGATIVE   Ketones, ur 80 (A) NEGATIVE mg/dL   Protein, ur NEGATIVE NEGATIVE mg/dL   Nitrite NEGATIVE NEGATIVE   Leukocytes,Ua NEGATIVE NEGATIVE   RBC / HPF 0-5 0 - 5 RBC/hpf   WBC, UA 0-5 0 - 5 WBC/hpf   Bacteria, UA RARE (A) NONE SEEN   Squamous Epithelial / LPF NONE SEEN 0 - 5   Mucus PRESENT     Comment: Performed at [facility], [address] [place], [place] [address]  Urine Drug Screen, Qualitative ([facility] only)     Status: Abnormal   Collection Time: 10/31/18  2:16 PM  Result Value Ref Range   Tricyclic, Ur Screen NONE DETECTED NONE DETECTED   Amphetamines, Ur Screen NONE DETECTED NONE DETECTED   MDMA (Ecstasy)Ur Screen NONE DETECTED NONE DETECTED   Cocaine Metabolite,Ur [place] NONE DETECTED NONE DETECTED   Opiate, Ur Screen NONE DETECTED NONE DETECTED   Phencyclidine (PCP) Ur S NONE DETECTED NONE DETECTED   Cannabinoid 50 Ng, Ur [place] POSITIVE (A) NONE DETECTED   Barbiturates, Ur Screen NONE DETECTED NONE DETECTED   Benzodiazepine, Ur Scrn POSITIVE (A) NONE DETECTED   Methadone Scn, Ur NONE DETECTED NONE DETECTED    Comment: (NOTE) Tricyclics + metabolites, urine    Cutoff 1000 ng/mL Amphetamines + metabolites, urine  Cutoff 1000 ng/mL MDMA (Ecstasy), urine              Cutoff 500 ng/mL Cocaine Metabolite, urine          Cutoff 300 ng/mL Opiate + metabolites, urine        Cutoff 300 ng/mL Phencyclidine (PCP), urine         Cutoff 25 ng/mL Cannabinoid, urine                 Cutoff 50 ng/mL Barbiturates + metabolites, urine  Cutoff 200 ng/mL Benzodiazepine, urine              Cutoff 200  ng/mL Methadone, urine                   Cutoff 300 ng/mL The urine drug screen provides only a preliminary, unconfirmed analytical test result and should not be used for non-medical purposes. Clinical consideration and professional judgment should be applied to any positive drug screen result due to possible interfering substances. A more specific alternate chemical method must be used in order to obtain a confirmed analytical result. Gas chromatography / mass spectrometry (GC/MS) is the preferred confirmat ory method. Performed at [facility], [address] [place], [place] [address]   Ethanol     Status: None   Collection Time: 10/31/18  3:34 PM  Result Value Ref Range   Alcohol, Ethyl (B) <10 <10 mg/dL    Comment: (NOTE) Lowest detectable limit for serum alcohol is 10 mg/dL. For medical purposes only. Performed at [facility], [address] [place], [place] [address]   Lipase, blood     Status: None   Collection Time: 10/31/18  3:34 PM  Result Value Ref Range   Lipase 30 11 - 51 U/L    Comment: Performed at [facility], [address] [place], [place] [address]  Lactic acid, plasma     Status: None   Collection Time: 10/31/18  3:34 PM  Result Value Ref Range   Lactic Acid, Venous 1.1 0.5 - 1.9 mmol/L    Comment: Performed at [facility], [address] [place], [place] [address]  Ammonia     Status: None   Collection Time: 10/31/18  3:34 PM  Result Value Ref Range   Ammonia 22 9 - 35 umol/L    Comment: Performed at [facility], [address] [place], [place] [address]  TSH     Status: None   Collection Time: 11/01/18  5:19 AM  Result Value Ref Range   TSH 1.569 0.350 - 4.500 uIU/mL    Comment: Performed by a 3rd Generation assay with a functional sensitivity of <=0.01 uIU/mL. Performed at [facility], [address] [place], [place] [address]   Glucose, capillary     Status: Abnormal    Collection Time: 11/01/18  1:35 PM  Result Value Ref Range   Glucose-Capillary 144 (H) 70 - 99 mg/dL  MRSA PCR Screening     Status: None   Collection Time: 11/01/18  1:37 PM   Specimen: Nasopharyngeal  Result Value Ref Range   MRSA by PCR NEGATIVE NEGATIVE    Comment:        The GeneXpert MRSA Assay (FDA approved for NASAL specimens only), is one component of a comprehensive MRSA colonization surveillance program. It is not intended to diagnose MRSA infection nor to guide or monitor treatment for MRSA infections. Performed at [facility], [address] [place], [place] [address]   Basic metabolic panel     Status: Abnormal   Collection Time: 11/01/18  3:45 PM  Result Value Ref Range   Sodium 140 135 - 145 mmol/L   Potassium 3.1 (L) 3.5 - 5.1 mmol/L   Chloride 104 98 - 111 mmol/L   CO2 27 22 - 32 mmol/L   Glucose, Bld 112 (H) 70 - 99 mg/dL   BUN 21 (H) 6 - 20 mg/dL   Creatinine, Ser 2.95 0.61 - 1.24 mg/dL   Calcium 9.1 8.9 - 62.1 mg/dL   GFR calc non Af Amer >60 >60 mL/min   GFR calc Af Amer >60 >60 mL/min   Anion gap 9 5 - 15    Comment: Performed at [facility], [address], [place], [place] [address]  Magnesium     Status: None   Collection Time: 11/01/18  3:45 PM  Result Value Ref Range   Magnesium 2.3 1.7 - 2.4 mg/dL    Comment: Performed at [facility], [address], [place], [place] [address]  Phosphorus     Status: None   Collection Time: 11/01/18  3:45 PM  Result Value Ref Range   Phosphorus 3.9 2.5 - 4.6 mg/dL    Comment: Performed at [facility], [address] [place], [place] [address]    Current Facility-Administered Medications  Medication Dose Route Frequency Provider Last Rate Last Dose  . acetaminophen (TYLENOL) tablet 650 mg  650 mg Oral Q6H PRN Arnaldo Natal, MD       Or  . acetaminophen (TYLENOL) suppository 650 mg  650 mg Rectal Q6H PRN Arnaldo Natal, MD      . albuterol (PROVENTIL)  (2.5 MG/3ML) 0.083% nebulizer solution 2.5 mg  2.5 mg Nebulization Q4H PRN Arnaldo Natal, MD      . Chlorhexidine Gluconate Cloth 2 % PADS 6  each  6 each Topical Daily Ottie Glazier, MD      . dexmedetomidine (PRECEDEX) 400 MCG/100ML (4 mcg/mL) infusion  0-1.2 mcg/kg/hr Intravenous Continuous Ottie Glazier, MD 21.8 mL/hr at 11/01/18 1430 1.2 mcg/kg/hr at 11/01/18 1430  . diphenhydrAMINE (BENADRYL) injection 50 mg  50 mg Intravenous Q6H Aleskerov, Fuad, MD      . enoxaparin (LOVENOX) injection 40 mg  40 mg Subcutaneous Q24H Harrie Foreman, MD   40 mg at 11/01/18 0905  . famotidine (PEPCID) IVPB 20 mg premix  20 mg Intravenous Q12H Aleskerov, Fuad, MD      . haloperidol lactate (HALDOL) injection 1 mg  1 mg Intravenous Q6H PRN Demetrios Loll, MD   1 mg at 11/01/18 1112  . HYDROmorphone (DILAUDID) injection 2 mg  2 mg Intravenous Q6H Aleskerov, Fuad, MD      . LORazepam (ATIVAN) injection 2 mg  2 mg Intramuscular Once Ottie Glazier, MD      . LORazepam (ATIVAN) injection 2-3 mg  2-3 mg Intravenous Q1H PRN Ottie Glazier, MD   3 mg at 11/01/18 1430  . nicotine (NICODERM CQ - dosed in mg/24 hours) patch 14 mg  14 mg Transdermal Daily Demetrios Loll, MD   14 mg at 11/01/18 1502  . ondansetron (ZOFRAN) tablet 4 mg  4 mg Oral Q6H PRN Harrie Foreman, MD       Or  . ondansetron [place]) injection 4 mg  4 mg Intravenous Q6H PRN Harrie Foreman, MD      . potassium chloride 10 mEq in 100 mL IVPB  10 mEq Intravenous Q1 Hr x 5 Charlett Nose, RPH      . sodium chloride 0.9 % 1,000 mL with thiamine 161 mg, folic acid 1 mg, multivitamins adult 10 mL infusion   Intravenous Continuous Ottie Glazier, MD        Musculoskeletal: Strength & Muscle Tone: within normal limits Gait & Station: unsteady Patient leans: N/A  Psychiatric Specialty Exam: Physical Exam  Nursing note and vitals reviewed. Constitutional: He appears well-developed and well-nourished.  HENT:  Head: Normocephalic.   Neck: Normal range of motion.  Respiratory: Effort normal.  Musculoskeletal: Normal range of motion.  Neurological: He is alert.  Psychiatric: Thought content normal. His mood appears anxious. His affect is blunt and labile. His speech is slurred. He is aggressive and hyperactive. Cognition and memory are impaired. He expresses inappropriate judgment. He is inattentive.    Review of Systems  Gastrointestinal: Positive for abdominal pain.  Psychiatric/Behavioral: Positive for memory loss and substance abuse. The patient is nervous/anxious.   All other systems reviewed and are negative.   Blood pressure (!) 138/101, pulse 93, temperature 98.1 F (36.7 C), temperature source Oral, resp. rate (!) 29, height 6' (1.829 m), weight 65.8 kg, SpO2 99 %.Body mass index is 19.67 kg/m.  General Appearance: Disheveled  Eye Contact:  Fair  Speech:  Slurred  Volume:  Normal  Mood:  Anxious and Irritable  Affect:  Blunt  Thought Process:  Irrelevant  Orientation:  Other:  person  Thought Content:  Tangential  Suicidal Thoughts:  No  Homicidal Thoughts:  No  Memory:  Immediate;   Poor Recent;   Poor Remote;   Poor  Judgement:  Impaired  Insight:  Lacking  Psychomotor Activity:  Increased  Concentration:  Concentration: Poor and Attention Span: Poor  Recall:  Poor  Fund of Knowledge:  Fair  Language:  Fair  Akathisia:  No  Handed:  Right  AIMS (  if indicated):     Assets:  Housing Intimacy Leisure Time Physical Health Resilience Vocational/Educational  ADL's:  Impaired  Cognition:  Impaired,  Moderate  Sleep:        Treatment Plan Summary: Daily contact with patient to assess and evaluate symptoms and progress in treatment, Medication management and Plan Benzodiazepine withdrawal:  -ICU Ativan withdrawal protocol  Agitation: -Haldol 10 mg IV -Start Haldol 5 mg every six hours until agitation resolves PRN  Disposition: Recommend psychiatric Inpatient admission when medically  cleared.  Nanine MeansJamison Girtie Wiersma, NP 11/01/2018 5:09 PM

## 2018-11-01 NOTE — Progress Notes (Signed)
Pharmacy Electrolyte Monitoring Consult:  Pharmacy consulted to assist in monitoring and replacing electrolytes in this 23 y.o. male admitted on 10/31/2018 with acute alcohol withdrawal and benzodiazepine withdrawal.   Labs:  Sodium (mmol/L)  Date Value  11/01/2018 140   Potassium (mmol/L)  Date Value  11/01/2018 3.1 (L)   Magnesium (mg/dL)  Date Value  11/01/2018 2.3   Phosphorus (mg/dL)  Date Value  11/01/2018 3.9   Calcium (mg/dL)  Date Value  11/01/2018 9.1   Albumin (g/dL)  Date Value  10/31/2018 4.4    Assessment/Plan: Will order potassium 73mEq IV Q1hr x 5 doses. Patient unable to tolerate oral medications at this time.   BMP/Magnesium with am labs.   Will replace to maintain potassium 3.6-4 and magnesium ~ 2.   Pharmacy will continue to monitor and adjust per consult.   Lakrisha Iseman L 11/01/2018 5:03 PM

## 2018-11-01 NOTE — Consult Note (Signed)
CRITICAL CARE NOTE      CHIEF COMPLAINT:   Acute alcohol and benzodiazepine withdrawal syndrome   HPI   This is a 23 yo male with hx of asthma, GERD, alcholism, and benzodiazepine abuse.  He came in to ED with alteration in mental status confused and agitated.  He was progressively combative and received Ativan and antipsychotic medications in ER.  [facility].  He was treated for same on floor but despite multiple doses of medications continued to be very aggressive.  He was brought to  MICU with consultation to critical care from Hospitalist team due to severe drug and alcohol withdrawal refractory to benzodiazepine therapy.  On arrival to MICU patient was loudly screaming with threatening language and security arrived to protect staff from violent behavior. He recived 4mg  of Ativan and 10mg  of IV haldol but still was kicking and required multiple officers to hold him down.  He was subsequently given 2mg  of Dilaudid and 50mg  of bennadryl and finally behavior de-escalated. He is resting in bed on room air with stable vital signs.   PAST MEDICAL HISTORY   Past Medical History:  Diagnosis Date  . Alcohol problem drinking   . Arthritis   . Asthma   . GERD (gastroesophageal reflux disease)      SURGICAL HISTORY   History reviewed. No pertinent surgical history.   FAMILY HISTORY   Family History  Problem Relation Age of Onset  . Drug abuse Other        Parent, grandparent  . Arthritis Other   . Heart disease Other        Grandparent  . Sudden death Other        Grandparent     SOCIAL HISTORY   Social History   Tobacco Use  . Smoking status: Current Every Day Smoker    Packs/day: 1.00    Types: Cigarettes  . Smokeless tobacco: Never Used  Substance Use Topics  .  Alcohol use: Yes    Alcohol/week: 12.0 standard drinks    Types: 12 Standard drinks or equivalent per week    Comment: occasional  . Drug use: Yes    Types: Marijuana     MEDICATIONS   Current Medication:  Current Facility-Administered Medications:  .  acetaminophen (TYLENOL) tablet 650 mg, 650 mg, Oral, Q6H PRN **OR** acetaminophen (TYLENOL) suppository 650 mg, 650 mg, Rectal, Q6H PRN, Arnaldo Nataliamond, Michael S, MD .  albuterol (PROVENTIL) (2.5 MG/3ML) 0.083% nebulizer solution 2.5 mg, 2.5 mg, Nebulization, Q4H PRN, Arnaldo Nataliamond, Michael S, MD .  dexmedetomidine (PRECEDEX) 400 MCG/100ML (4 mcg/mL) infusion, 0-1.2 mcg/kg/hr, Intravenous, Continuous, Ophie Burrowes, MD, Last Rate: 3.63 mL/hr at 11/01/18 1328, 0.2 mcg/kg/hr at 11/01/18 1328 .  docusate sodium (COLACE) capsule 100 mg, 100 mg, Oral, BID, Arnaldo Nataliamond, Michael S, MD, 100 mg at 11/01/18 0906 .  enoxaparin (LOVENOX) injection 40 mg, 40 mg, Subcutaneous, Q24H, Arnaldo Nataliamond, Michael S, MD, 40 mg at 11/01/18 0905 .  famotidine (PEPCID) tablet 20 mg, 20 mg, Oral, BID, Aviyana Sonntag, MD .  folic acid (FOLVITE) tablet 1 mg, 1 mg, Oral, Daily, Arnaldo Nataliamond, Michael S, MD, 1 mg at 11/01/18 [id number] .  haloperidol lactate (HALDOL) injection 1 mg, 1 mg, Intravenous, Q6H PRN, Shaune Pollackhen, Qing, MD, 1 mg at 11/01/18 1112 .  haloperidol lactate (HALDOL) injection 10 mg, 10 mg, Intravenous, Once, Vida RiggerAleskerov, Halley Kincer, MD .  loratadine (CLARITIN) tablet 10 mg, 10 mg, Oral, Daily, Arnaldo Nataliamond, Michael S, MD, 10 mg at 11/01/18 0906 .  LORazepam (ATIVAN) injection 2 mg, 2 mg, Intramuscular, Once, Tran Randle, MD .  LORazepam (ATIVAN) injection 2-3 mg, 2-3 mg, Intravenous, Q1H PRN, Vida RiggerAleskerov, Abimelec Grochowski, MD, 3 mg at 11/01/18 1343 .  multivitamin with minerals tablet 1 tablet, 1 tablet, Oral, Daily, Arnaldo Nataliamond, Michael S, MD .  nicotine (NICODERM CQ - dosed in mg/24 hours) patch 14 mg, 14 mg, Transdermal, Daily, Shaune Pollackhen, Qing, MD .  ondansetron [facility](ZOFRAN) tablet 4 mg, 4 mg, Oral, Q6H PRN **OR**  ondansetron (ZOFRAN) injection 4 mg, 4 mg, Intravenous, Q6H PRN, Arnaldo Nataliamond, Michael S, MD .  pantoprazole (PROTONIX) EC tablet 40 mg, 40 mg, Oral, Daily, Arnaldo Nataliamond, Michael S, MD, 40 mg at 11/01/18 0906 .  thiamine (VITAMIN B-1) tablet 100 mg, 100 mg, Oral, Daily, 100 mg at 11/01/18 1005 **OR** thiamine (B-1) injection 100 mg, 100 mg, Intravenous, Daily, Arnaldo Nataliamond, Michael S, MD    ALLERGIES   Patient has no known allergies.    REVIEW OF SYSTEMS     Unable to obtain as patient is not appropriate to answer any questions due to psychosis with aggresion secondary to withdrawal  PHYSICAL EXAMINATION   Vitals:   11/01/18 1254 11/01/18 1331  BP:  (!) 138/101  Pulse: (!) 141 93  Resp:  (!) 29  Temp:  98.1 F (36.7 C)  SpO2: 98% 99%    GENERAL:severe aggitation HEAD: Normocephalic, atraumatic.  EYES: Pupils equal, round, reactive to light.  No scleral icterus.  MOUTH: Moist mucosal membrane. NECK: Supple. No thyromegaly. No nodules. No JVD.  PULMONARY: CTAB CARDIOVASCULAR: S1 and S2. Regular rate and rhythm. No murmurs, rubs, or gallops.  GASTROINTESTINAL: Soft, nontender, non-distended. No masses. Positive bowel sounds. No hepatosplenomegaly.  MUSCULOSKELETAL: No swelling, clubbing, or edema.  NEUROLOGIC: Extremely aggitated SKIN:intact,warm,dry   PERTINENT DATA      Lines / Drains: PIV  Cultures / Sepsis markers: none  Antibiotics: none   Protocols / Consultants: Psychiatry evaluation  Tests / Events: none     Infusions: . dexmedetomidine (PRECEDEX) IV infusion 0.2 mcg/kg/hr (11/01/18 1328)   Scheduled Medications: . docusate sodium  100 mg Oral BID  . enoxaparin (LOVENOX) injection  40 mg Subcutaneous Q24H  . famotidine  20 mg Oral BID  . folic acid  1 mg Oral Daily  . haloperidol lactate  10 mg Intravenous Once  . loratadine  10 mg Oral Daily  . LORazepam  2 mg Intramuscular Once  . multivitamin with minerals  1 tablet Oral Daily  . nicotine  14 mg  Transdermal Daily  . pantoprazole  40 mg Oral Daily  . thiamine  100 mg Oral Daily   Or  . thiamine  100 mg Intravenous Daily   PRN Medications: acetaminophen **OR** acetaminophen, albuterol, haloperidol lactate, LORazepam, ondansetron **OR** ondansetron (ZOFRAN) IV Hemodynamic parameters:   Intake/Output: No intake/output data recorded.  Ventilator  Settings:      LAB RESULTS:  Basic Metabolic Panel: Recent Labs  Lab 10/29/18 1711 10/31/18 1413  NA 130* 136  K 2.9* 3.0*  CL 82* 100  CO2 28 26  GLUCOSE 118* 137*  BUN 34* 22*  CREATININE 0.97 0.48*  CALCIUM 10.6* 9.3  MG 2.5*  --    Liver Function Tests: Recent Labs  Lab 10/29/18 1711 10/31/18 1413  AST 17 14*  ALT 28 18  ALKPHOS 82 67  BILITOT 2.3* 1.0  PROT 8.9* 7.2  ALBUMIN 5.7* 4.4   Recent Labs  Lab 10/29/18 1711 10/31/18 1534  LIPASE 18 30   Recent Labs  Lab 10/31/18 1534  AMMONIA 22   CBC: Recent Labs  Lab 10/29/18 1711 10/31/18 1413  WBC 21.8*  21.8* 12.8*  NEUTROABS 17.2*  --   HGB 18.5*  18.4* 15.3  HCT 50.0  50.1 43.3  MCV 79.6*  79.5* 82.5  PLT 403*  402* 275   Cardiac Enzymes: No results for input(s): CKTOTAL, CKMB, CKMBINDEX, TROPONINI in the last 168 hours. BNP: Invalid input(s): POCBNP CBG: Recent Labs  Lab 11/01/18 1335  GLUCAP 144*     IMAGING RESULTS:  Imaging: Ct Head Wo Contrast  Result Date: 10/31/2018 CLINICAL DATA:  Altered level of consciousness EXAM: CT HEAD WITHOUT CONTRAST TECHNIQUE: Contiguous axial images were obtained from the base of the skull through the vertex without intravenous contrast. COMPARISON:  10/29/2018 FINDINGS: Brain: No acute intracranial abnormality. Specifically, no hemorrhage, hydrocephalus, mass lesion, acute infarction, or significant intracranial injury. Vascular: No hyperdense vessel or unexpected calcification. Skull: No acute calvarial abnormality. Sinuses/Orbits: Visualized paranasal sinuses and mastoids clear. Orbital  soft tissues unremarkable. Other: Small amount of soft tissue gas again noted near the right skull base, decreased since prior study related to prior pneumomediastinum. IMPRESSION: No acute intracranial abnormality. Improving soft tissue gas near the skull base. Electronically Signed   By: Rolm Baptise M.D.   On: 10/31/2018 16:07   Dg Chest Portable 1 View  Result Date: 10/31/2018 CLINICAL DATA:  Pneumomediastinum EXAM: PORTABLE CHEST 1 VIEW COMPARISON:  10/29/2018 FINDINGS: Previously seen pneumomediastinum by plain film and CT not definitively seen on today's study. Pneumomediastinum was easier to visualize on prior CT. No pneumothorax. Lungs are clear. Heart is normal size. No effusions. IMPRESSION: No visible pneumomediastinum or pneumothorax. Electronically Signed   By: Rolm Baptise M.D.   On: 10/31/2018 15:53      ASSESSMENT AND PLAN    -Multidisciplinary rounds held today  Acute Alcohol and benzodiazepine withdrawal  - folate/thiamine infusion  - CIWA protocol   - high tolerance of drugs , no response post multiple doses of Ativan - psychiatry consultation    GI/Nutrition GI PROPHYLAXIS as indicated DIET-->TF's as tolerated Constipation protocol as indicated  ENDO - ICU hypoglycemic\Hyperglycemia protocol -check FSBS per protocol   ELECTROLYTES -follow labs as needed -replace as needed -pharmacy consultation   DVT/GI PRX ordered -SCDs  TRANSFUSIONS AS NEEDED MONITOR FSBS ASSESS the need for LABS as needed   Critical care provider statement:    Critical care time (minutes):  32   Critical care time was exclusive of:  Separately billable procedures and treating other patients   Critical care was necessary to treat or prevent imminent or life-threatening deterioration of the following conditions:  acute drug withdrawal, acute alcohol withdrawal, asthma , dm, GERD, electrolyte impairment.    Critical care was time spent personally by me on the following activities:   Development of treatment plan with patient or surrogate, discussions with consultants, evaluation of patient's response to treatment, examination of patient, obtaining history from patient or surrogate, ordering and performing treatments and interventions, ordering and review of laboratory studies and re-evaluation of patient's condition.  I assumed direction of critical care for this patient from another provider in my specialty: no    This document was prepared using Dragon voice recognition software and may include unintentional dictation errors.    Ottie Glazier, M.D.  Division of [place]

## 2018-11-01 NOTE — ED Provider Notes (Signed)
Vitals:   11/01/18 0210 11/01/18 0346  BP: 126/90 (!) 143/108  Pulse: 93 78  Resp: 18 17  Temp: (!) 100.5 F (38.1 C) 99.4 F (37.4 C)  SpO2: 98% 99%    Patient alert.  Able to sit comfortably in wheelchair, escorted with nurse and security to the admitting bed.  Alert appears improved.  In no acute distress.   Delman Kitten, MD 11/01/18 0500

## 2018-11-01 NOTE — Progress Notes (Signed)
MD notified of vitals.  

## 2018-11-01 NOTE — Progress Notes (Signed)
Report given to Royston Sinner Rn

## 2018-11-01 NOTE — ED Notes (Signed)
ED provider updated on patient vital signs and presentation. No new orders, but provider reaching out to [facility] for additional information.

## 2018-11-01 NOTE — ED Notes (Signed)
ED TO INPATIENT HANDOFF REPORT  ED Nurse Name and Phone #: terry [phone]   S Name/Age/Gender Ryan PesterJeffrey Tylar Booth 23 y.o. male Room/Bed: ED20A/ED20AA  Code Status   Code Status: Full Code  Home/SNF/Other Home Patient oriented to: self Is this baseline? No   Triage Complete: Triage complete  Chief Complaint altered mental status  Triage Note Pt to ED with AMS. Pt recently seen here for emesis and [facility]. [facility]. Pt now presents with AMS. Per mother pt is not eating and not at baseline.    Allergies No Known Allergies  Level of Care/Admitting Diagnosis ED Disposition    ED Disposition Condition Comment   Admit  [facility]ospital [facility] [[id number]]  Level of Care: Med-Surg [16]  Covid Evaluation: Asymptomatic Screening Protocol (No Symptoms)  Diagnosis: Benzodiazepine withdrawal [facility](HCC) [[id number]][[id number]]  Admitting Physician: Arnaldo NatalDIAMOND, MICHAEL S [[id number]]  Attending Physician: Arnaldo NatalDIAMOND, MICHAEL S [[id number]]  Estimated length of stay: past midnight tomorrow  Certification:: I certify this patient will need inpatient services for at least 2 midnights  PT Class (Do Not Modify): Inpatient [101]  PT Acc Code (Do Not Modify): Private [1]       B Medical/Surgery History Past Medical History:  Diagnosis Date  . Alcohol problem drinking   . Arthritis   . Asthma   . GERD (gastroesophageal reflux disease)    History reviewed. No pertinent surgical history.   A IV Location/Drains/Wounds Patient Lines/Drains/Airways Status   Active Line/Drains/Airways    None          Intake/Output Last 24 hours No intake or output data in the 24 hours ending 11/01/18 0436  Labs/Imaging Results for orders placed or performed during the hospital encounter of 10/31/18 (from the past 48 hour(s))  Comprehensive metabolic panel     Status: Abnormal   Collection Time: 10/31/18  2:13 PM  Result Value Ref Range   Sodium 136 135 - 145 mmol/L    Potassium 3.0 (L) 3.5 - 5.1 mmol/L   Chloride 100 98 - 111 mmol/L   CO2 26 22 - 32 mmol/L   Glucose, Bld 137 (H) 70 - 99 mg/dL   BUN 22 (H) 6 - 20 mg/dL   Creatinine, Ser 4.130.48 (L) 0.61 - 1.24 mg/dL   Calcium 9.3 8.9 - 24.410.3 mg/dL   Total Protein 7.2 6.5 - 8.1 g/dL   Albumin 4.4 3.5 - 5.0 g/dL   AST 14 (L) 15 - 41 U/L   ALT 18 0 - 44 U/L   Alkaline Phosphatase 67 38 - 126 U/L   Total Bilirubin 1.0 0.3 - 1.2 mg/dL   GFR calc non Af Amer >60 >60 mL/min   GFR calc Af Amer >60 >60 mL/min   Anion gap 10 5 - 15    Comment: Performed at [facility], [address] [place], [place] [address]  CBC     Status: Abnormal   Collection Time: 10/31/18  2:13 PM  Result Value Ref Range   WBC 12.8 (H) 4.0 - 10.5 K/uL   RBC 5.25 4.22 - 5.81 MIL/uL   Hemoglobin 15.3 13.0 - 17.0 g/dL   HCT 25.343.3 66.439.0 - 40.352.0 %   MCV 82.5 80.0 - 100.0 fL   MCH 29.1 26.0 - 34.0 pg   MCHC 35.3 30.0 - 36.0 g/dL   RDW 47.411.9 25.911.5 - 56.315.5 %   Platelets 275 150 - 400 K/uL   nRBC 0.0 0.0 - 0.2 %  Comment: Performed at [facility], [address] [place], [place] [address]  Urinalysis, Complete w Microscopic     Status: Abnormal   Collection Time: 10/31/18  2:16 PM  Result Value Ref Range   Color, Urine YELLOW (A) YELLOW   APPearance HAZY (A) CLEAR   Specific Gravity, Urine 1.031 (H) 1.005 - 1.030   pH 6.0 5.0 - 8.0   Glucose, UA NEGATIVE NEGATIVE mg/dL   Hgb urine dipstick NEGATIVE NEGATIVE   Bilirubin Urine NEGATIVE NEGATIVE   Ketones, ur 80 (A) NEGATIVE mg/dL   Protein, ur NEGATIVE NEGATIVE mg/dL   Nitrite NEGATIVE NEGATIVE   Leukocytes,Ua NEGATIVE NEGATIVE   RBC / HPF 0-5 0 - 5 RBC/hpf   WBC, UA 0-5 0 - 5 WBC/hpf   Bacteria, UA RARE (A) NONE SEEN   Squamous Epithelial / LPF NONE SEEN 0 - 5   Mucus PRESENT     Comment: Performed at [facility], [address] [place], [place] [address]  Urine Drug Screen, Qualitative ([facility] only)     Status: Abnormal   Collection Time:  10/31/18  2:16 PM  Result Value Ref Range   Tricyclic, Ur Screen NONE DETECTED NONE DETECTED   Amphetamines, Ur Screen NONE DETECTED NONE DETECTED   MDMA (Ecstasy)Ur Screen NONE DETECTED NONE DETECTED   Cocaine Metabolite,Ur [place] NONE DETECTED NONE DETECTED   Opiate, Ur Screen NONE DETECTED NONE DETECTED   Phencyclidine (PCP) Ur S NONE DETECTED NONE DETECTED   Cannabinoid 50 Ng, Ur [place] POSITIVE (A) NONE DETECTED   Barbiturates, Ur Screen NONE DETECTED NONE DETECTED   Benzodiazepine, Ur Scrn POSITIVE (A) NONE DETECTED   Methadone Scn, Ur NONE DETECTED NONE DETECTED    Comment: (NOTE) Tricyclics + metabolites, urine    Cutoff 1000 ng/mL Amphetamines + metabolites, urine  Cutoff 1000 ng/mL MDMA (Ecstasy), urine              Cutoff 500 ng/mL Cocaine Metabolite, urine          Cutoff 300 ng/mL Opiate + metabolites, urine        Cutoff 300 ng/mL Phencyclidine (PCP), urine         Cutoff 25 ng/mL Cannabinoid, urine                 Cutoff 50 ng/mL Barbiturates + metabolites, urine  Cutoff 200 ng/mL Benzodiazepine, urine              Cutoff 200 ng/mL Methadone, urine                   Cutoff 300 ng/mL The urine drug screen provides only a preliminary, unconfirmed analytical test result and should not be used for non-medical purposes. Clinical consideration and professional judgment should be applied to any positive drug screen result due to possible interfering substances. A more specific alternate chemical method must be used in order to obtain a confirmed analytical result. Gas chromatography / mass spectrometry (GC/MS) is the preferred confirmat ory method. Performed at [facility], [address] [place], [place] [address]   Ethanol     Status: None   Collection Time: 10/31/18  3:34 PM  Result Value Ref Range   Alcohol, Ethyl (B) <10 <10 mg/dL    Comment: (NOTE) Lowest detectable limit for serum alcohol is 10 mg/dL. For medical purposes only. Performed at [facility], [address] [place], [place] [address]   Lipase, blood     Status: None   Collection  Time: 10/31/18  3:34 PM  Result Value Ref Range   Lipase 30 11 - 51 U/L    Comment: Performed at [facility], [address] [place], [place] [address]  Lactic acid, plasma     Status: None   Collection Time: 10/31/18  3:34 PM  Result Value Ref Range   Lactic Acid, Venous 1.1 0.5 - 1.9 mmol/L    Comment: Performed at [facility], [address] [place], [place] [address]  Ammonia     Status: None   Collection Time: 10/31/18  3:34 PM  Result Value Ref Range   Ammonia 22 9 - 35 umol/L    Comment: Performed at [facility], [address] [place], [place] [address]   Ct Head Wo Contrast  Result Date: 10/31/2018 CLINICAL DATA:  Altered level of consciousness EXAM: CT HEAD WITHOUT CONTRAST TECHNIQUE: Contiguous axial images were obtained from the base of the skull through the vertex without intravenous contrast. COMPARISON:  10/29/2018 FINDINGS: Brain: No acute intracranial abnormality. Specifically, no hemorrhage, hydrocephalus, mass lesion, acute infarction, or significant intracranial injury. Vascular: No hyperdense vessel or unexpected calcification. Skull: No acute calvarial abnormality. Sinuses/Orbits: Visualized paranasal sinuses and mastoids clear. Orbital soft tissues unremarkable. Other: Small amount of soft tissue gas again noted near the right skull base, decreased since prior study related to prior pneumomediastinum. IMPRESSION: No acute intracranial abnormality. Improving soft tissue gas near the skull base. Electronically Signed   By: Charlett NoseKevin  Dover M.D.   On: 10/31/2018 16:07   Dg Chest Portable 1 View  Result Date: 10/31/2018 CLINICAL DATA:  Pneumomediastinum EXAM: PORTABLE CHEST 1 VIEW COMPARISON:  10/29/2018 FINDINGS: Previously seen pneumomediastinum by plain film and CT not definitively seen on today's study. Pneumomediastinum was easier to  visualize on prior CT. No pneumothorax. Lungs are clear. Heart is normal size. No effusions. IMPRESSION: No visible pneumomediastinum or pneumothorax. Electronically Signed   By: Charlett NoseKevin  Dover M.D.   On: 10/31/2018 15:53    Pending Labs Unresulted Labs (From admission, onward)    Start     Ordered   11/01/18 0408  SARS CORONAVIRUS 2 Nasal Swab Aptima Multi Swab  (Asymptomatic/Tier 2)  ONCE - STAT,   STAT    Question Answer Comment  Is this test for diagnosis or screening Screening   Symptomatic for COVID-19 as defined by CDC No   Hospitalized for COVID-19 No   Admitted to ICU for COVID-19 No   Previously tested for COVID-19 Yes   Resident in a congregate (group) care setting No   Employed in healthcare setting No      11/01/18 0407   Signed and Held  Creatinine, serum  (enoxaparin (LOVENOX)    CrCl >/= 30 ml/min)  Weekly,   R    Comments: while on enoxaparin therapy    Signed and Held   Signed and Held  TSH  Add-on,   R     Signed and Held          Vitals/Pain Today's Vitals   10/31/18 1918 10/31/18 1944 11/01/18 0210 11/01/18 0346  BP:   126/90 (!) 143/108  Pulse:   93 78  Resp: 18  18 17   Temp:   (!) 100.5 F (38.1 C) 99.4 F (37.4 C)  TempSrc:   Oral Oral  SpO2:   98% 99%  Weight:      Height:      PainSc:  0-No pain      Isolation Precautions No active isolations  Medications Medications  LORazepam (ATIVAN) injection 0-4 mg ( Intravenous See Alternative 11/01/18 0406)    Or  LORazepam (ATIVAN) tablet 0-4 mg (2 mg Oral Given 11/01/18 0406)  LORazepam (ATIVAN) injection 0-4 mg (has no administration in time range)    Or  LORazepam (ATIVAN) tablet 0-4 mg (has no administration in time range)  thiamine (VITAMIN B-1) tablet 100 mg (has no administration in time range)    Or  thiamine (B-1) injection 100 mg (has no administration in time range)  ziprasidone (GEODON) injection 20 mg (20 mg Intramuscular Given 10/31/18 2012)  LORazepam (ATIVAN) injection 2 mg (2 mg  Intramuscular Given 10/31/18 2205)  ziprasidone (GEODON) injection 10 mg (10 mg Intramuscular Given 11/01/18 0140)  LORazepam (ATIVAN) injection 2 mg (2 mg Intramuscular Given 11/01/18 0139)    Mobility walks Low fall risk   Focused Assessments psych   R Recommendations: See Admitting Provider Note  Report given to:   Additional Notes:

## 2018-11-01 NOTE — ED Notes (Signed)
Patient being transported to the medical floor with law enforcement and staff.

## 2018-11-01 NOTE — Progress Notes (Signed)
Patient given IM medications per MD orders due to agitations and posturing. Will continue to monitor closely for safety.

## 2018-11-01 NOTE — Progress Notes (Signed)
Pt is very confused, stares into space and is making conversations with air. Unable to re-orient or get pt to follow instructions. Progressively getting more agitated. Pt paces room and stood on bed, RN is unable to control pt and called security to stand by to administer medications for his safety. Pt slipped and fell while walking out of his bathroom with arm landing on trash can. This was witnessed and pt did not hit head. Pt got right back up and began to pace. No acute orientation changes from this fall. Unable to get full sets of vitals due to pacing and Pt becoming stiff and contracting during blood pressure monitoring. Pt pulled out IV after Haldol administration. Multiple staff required to monitor pt. RN notified MD of fall and that medications were not helping pt calm down, or improve his orientation. MD placed orders to transfer pt to higher level of care. Family updated.

## 2018-11-01 NOTE — ED Provider Notes (Signed)
Patient agitation is improved.  Is noted now to be mildly febrile.  [facility] notes are not yet complete, have paged the attending 3 [facility] to discuss his case and recent discharge.  There are notations that appear to suggest he needs to be on antibiotic therapy, he was worked up for mediastinitis.  He is alert, does not have any obvious acute complaint but the low-grade fever and his recent concern for mediastinitis raise concern for possible bacteremia or other infectious etiology driving his presentation.  Notes that [facility] also suggest he had abnormal behavior hallucinations was seeing things at that time as well, so this does not seem to be a sudden acute change based on what I can see in the notes but still waiting to talk to [facility] physician/team that was caring for him to get further information as the discharge summary does not quite seem complete     Delman Kitten, MD 11/01/18 [phone]

## 2018-11-01 NOTE — ED Notes (Signed)
Have not been able to obtain vital signs due to labile behaviors.

## 2018-11-01 NOTE — ED Provider Notes (Signed)
Discussed with [facility]- Dr. Waldemar Dickens.  Had a normal swallow study at [facility], tolerated diet. [facility] found patient was in withdrawal and no concern for esophageal perforation. Had NORMAL barium esophogram. Not sent home on abx. There was concern for with withdrawals. Was hallicinating, improved on CIWA.    Delman Kitten, MD 11/01/18 [phone]

## 2018-11-01 NOTE — ED Provider Notes (Signed)
Patient becoming agitated, posturing toward security officers very aggressively.  Previously had received dose of antipsychotic and Ativan.  He is beginning to become agitated, will give additional Geodon and Ativan intramuscular now for elevating agitation and aggressive behaviors.   Delman Kitten, MD 11/01/18 0130

## 2018-11-02 LAB — BASIC METABOLIC PANEL
Anion gap: 9 (ref 5–15)
BUN: 20 mg/dL (ref 6–20)
CO2: 24 mmol/L (ref 22–32)
Calcium: 9 mg/dL (ref 8.9–10.3)
Chloride: 108 mmol/L (ref 98–111)
Creatinine, Ser: 0.7 mg/dL (ref 0.61–1.24)
GFR calc Af Amer: >60 mL/min (ref >60)
GFR calc non Af Amer: >60 mL/min (ref >60)
Glucose, Bld: 112 mg/dL — ABNORMAL HIGH (ref 70–99)
Potassium: 3.9 mmol/L (ref 3.5–5.1)
Sodium: 141 mmol/L (ref 135–145)

## 2018-11-02 LAB — MAGNESIUM: Magnesium: 2.2 mg/dL (ref 1.7–2.4)

## 2018-11-02 LAB — SARS CORONAVIRUS 2 (TAT 6-24 HRS): SARS Coronavirus 2: NEGATIVE

## 2018-11-02 MED ORDER — FAMOTIDINE 20 MG PO TABS
20.0000 mg | ORAL_TABLET | Freq: Two times a day (BID) | ORAL | Status: DC
  Administered 2018-11-02 – 2018-11-03 (×2): 20 mg via ORAL
  Filled 2018-11-02 (×2): qty 1

## 2018-11-02 MED ORDER — POTASSIUM CHLORIDE 10 MEQ/100ML IV SOLN
10.0000 meq | INTRAVENOUS | Status: DC
  Filled 2018-11-02 (×4): qty 100

## 2018-11-02 NOTE — Progress Notes (Signed)
Pt endorses to writing RN that he takes xanax quite regularly and often outside the hospital.  He did state that he did not take it because he feels anxious, he only takes it because it makes him "happy."  His girlfriend informed the sitter in the room that the patient takes "a lot of xanax," writing RN did not hear this conversation.  Pt also stated that he stopped taking xanax when he moved back in with his mother about a week ago because he did not want her to know he was taking it, and started feeling unwell after that.  He has no memory of yesterday

## 2018-11-02 NOTE — Progress Notes (Signed)
PHARMACIST - PHYSICIAN COMMUNICATION  DR:   Lanney Gins  CONCERNING: IV to Oral Route Change Policy  RECOMMENDATION: This patient is receiving famotidine by the intravenous route.  Based on criteria approved by the Pharmacy and Therapeutics Committee, the intravenous medication(s) is/are being converted to the equivalent oral dose form(s).   DESCRIPTION: These criteria include:  The patient is eating (either orally or via tube) and/or has been taking other orally administered medications for at least 24 hours  The patient has no evidence of active gastrointestinal bleeding or impaired GI absorption (gastrectomy, short bowel, patient on TNA or NPO).  If you have questions about this conversion, please contact the [place], PharmD 11/02/2018 11:54 AM

## 2018-11-02 NOTE — Progress Notes (Signed)
[facility] - [place] [facility]   PATIENT NAME: Ryan Booth    MR#:  [id number]  DATE OF BIRTH:  01/03/1996  SUBJECTIVE:  CHIEF COMPLAINT:   Chief Complaint  Patient presents with  . Altered Mental Status   The patient is alert, awake and oriented this morning.  He has no complaints.  He is off Precedex drip.  He cannot remember what happened to him. REVIEW OF SYSTEMS:  Review of Systems  Constitutional: Negative for chills, fever and malaise/fatigue.  HENT: Negative for sore throat.   Eyes: Negative for blurred vision and double vision.  Respiratory: Negative for cough, hemoptysis, shortness of breath, wheezing and stridor.   Cardiovascular: Negative for chest pain, palpitations, orthopnea and leg swelling.  Gastrointestinal: Negative for abdominal pain, blood in stool, diarrhea, melena, nausea and vomiting.  Genitourinary: Negative for dysuria, flank pain and hematuria.  Musculoskeletal: Negative for back pain and joint pain.  Skin: Negative for rash.  Neurological: Negative for dizziness, sensory change, focal weakness, seizures, loss of consciousness, weakness and headaches.  Endo/Heme/Allergies: Negative for polydipsia.  Psychiatric/Behavioral: Negative for depression. The patient is not nervous/anxious.     DRUG ALLERGIES:  No Known Allergies VITALS:  Blood pressure 128/88, pulse 98, temperature 98.7 F (37.1 C), temperature source Axillary, resp. rate 18, height 6' (1.829 m), weight 68.7 kg, SpO2 99 %. PHYSICAL EXAMINATION:  Physical Exam Constitutional:      General: He is not in acute distress.    Appearance: Normal appearance.  HENT:     Head: Normocephalic.     Mouth/Throat:     Mouth: Mucous membranes are moist.  Eyes:     General: No scleral icterus.    Conjunctiva/sclera: Conjunctivae normal.     Pupils: Pupils are equal, round, and reactive to light.  Neck:     Musculoskeletal: Normal range of motion and neck supple.   Vascular: No JVD.     Trachea: No tracheal deviation.  Cardiovascular:     Rate and Rhythm: Normal rate and regular rhythm.     Heart sounds: Normal heart sounds. No murmur. No gallop.   Pulmonary:     Effort: Pulmonary effort is normal. No respiratory distress.     Breath sounds: Normal breath sounds. No wheezing or rales.  Abdominal:     General: Bowel sounds are normal. There is no distension.     Palpations: Abdomen is soft.     Tenderness: There is no abdominal tenderness. There is no rebound.  Musculoskeletal: Normal range of motion.        General: No tenderness.     Right lower leg: No edema.     Left lower leg: No edema.  Skin:    Findings: No erythema or rash.  Neurological:     General: No focal deficit present.     Mental Status: He is alert and oriented to person, place, and time.     Cranial Nerves: No cranial nerve deficit.  Psychiatric:        Mood and Affect: Mood normal.    LABORATORY PANEL:  Male CBC Recent Labs  Lab 10/31/18 1413  WBC 12.8*  HGB 15.3  HCT 43.3  PLT 275   ------------------------------------------------------------------------------------------------------------------ Chemistries  Recent Labs  Lab 10/31/18 1413  11/02/18 0449  NA 136   < > 141  K 3.0*   < > 3.9  CL 100   < > 108  CO2 26   < > 24  GLUCOSE 137*   < >  112*  BUN 22*   < > 20  CREATININE 0.48*   < > 0.70  CALCIUM 9.3   < > 9.0  MG  --    < > 2.2  AST 14*  --   --   ALT 18  --   --   ALKPHOS 67  --   --   BILITOT 1.0  --   --    < > = values in this interval not displayed.   RADIOLOGY:  No results found. ASSESSMENT AND PLAN:   This is a 23 year old male admitted for benzodiazepine withdrawal. 1.  Withdrawal symptoms:  Continue CIWA protocol.  Seizure precautions.  On IVC. Haldol 5 mg every six hours prn until agitation resolves and Recommend psychiatric Inpatient admission when medically cleared for psych consult.   2.  Hypokalemia: Improved with  supplement.  Magnesium is normal. 3.  SIRS: No indication of infectious source.  Symptoms secondary to withdrawal.    Improved.   4.  Asthma: Controlled; albuterol as needed  All the records are reviewed and case discussed with Care Management/Social Worker. Management plans discussed with the patient, family and they are in agreement.  CODE STATUS: Full Code  TOTAL TIME TAKING CARE OF THIS PATIENT: 25 minutes.   More than 50% of the time was spent in counseling/coordination of care: YES  POSSIBLE D/C IN 1-2 DAYS, DEPENDING ON CLINICAL CONDITION.   Demetrios Loll M.D on 11/02/2018 at 3:16 PM  Between 7am to 6pm - Pager - [phone]  After 6pm go to [email or web address] - [facility]

## 2018-11-02 NOTE — Progress Notes (Signed)
Precedex paused due to bradycardia in the 30s. Pt now awake, reoriented and speaking to family on the phone. Calm and cooperative at this time.

## 2018-11-02 NOTE — Consult Note (Signed)
[facility] Face-to-Face Psychiatry Consult   Reason for Consult:  Benzo withdrawal Referring Physician:  EDP Patient Identification: Ryan Booth MRN:  [id number] Principal Diagnosis: Benzodiazepine withdrawal (HCC) Diagnosis:  Principal Problem:   Benzodiazepine withdrawal (HCC) Active Problems:   Delirium tremens (HCC)   Total Time spent with patient: 30 minutes  Subjective:   Ryan Booth is a 23 y.o. male patient reports that he is feeling much better today.  Patient states he does not really remember a whole lot about yesterday.  He states that he remembers being sick and vomiting and that is it.  He states he does not remember using any drugs recently.  Patient denies any suicidal or homicidal ideations and denies any hallucinations.  Patient states that he does want to get assistance with therapy and counseling for his substance use.  Patient reports that he lives at home with his mother, father, girlfriend, and his daughter.  Patient states that he has learned his lesson to stay away from drugs and is going to stop using them.  Patient's mother, Arline Asp, is at bedside with him.  She reports that he is much much better than he was this past weekend in the last couple of days.  She states that since Saturday he was having some hallucinations as well as being sick.  She states he was making comments that did not make any sense and was not completing sentences.  She states today he appears to be more like himself and she has no concerns about his safety.  HPI:  Per Dr Marisa Severin: 23 y.o. male with PMH as noted below who presents with altered mental status over the last several days, gradual onset, and worsening today.  The patient initially was seen in the ED 2 days ago with persistent nausea and vomiting.  Work-up revealed findings concerning for pneumomediastinum and the patient was transferred to [facility] cardiothoracic surgery for further evaluation.  He apparently was discharged yesterday.   The mother states that since that time he has been increasingly confused, although he was already somewhat confused during the admission there.  The mother reports that the patient is answering questions that nobody is asked him, talking to himself and to walls, and making no sense. She reports that he has a history of marijuana use and there is also some concern about possibly abusing Xanax.  With the mother out of the room, the patient denies both of these.  The mother also reports that the patient had an episode in which he was involuntarily committed with depression and suicidal ideation around the age of 32 but does not have other psychiatric history.   Patient is seen by this provider face-to-face.  Patient is calm, cooperative, and pleasant.  Based off the report it appears there is the patient is experiencing benzodiazepine withdrawal.  There is no history of psychiatric issues except for some aggression when he was a younger child and was placed that behavioral health in [place].  Feel that patient needs to be monitored overnight to ensure that there are no other issues.  Patient has continually denied any suicidal or homicidal ideations and denies any hallucinations.  After reassessment tomorrow we will decide whether patient meets inpatient criteria or not, but at this time he does not.  Past Psychiatric History: Aggression as a child  Risk to Self:   Risk to Others:   Prior Inpatient Therapy:   Prior Outpatient Therapy:    Past Medical History:  Past Medical History:  Diagnosis  Date  . Alcohol problem drinking   . Arthritis   . Asthma   . GERD (gastroesophageal reflux disease)    History reviewed. No pertinent surgical history. Family History:  Family History  Problem Relation Age of Onset  . Drug abuse Other        Parent, grandparent  . Arthritis Other   . Heart disease Other        Grandparent  . Sudden death Other        Grandparent   Family Psychiatric  History:  None reported Social History:  Social History   Substance and Sexual Activity  Alcohol Use Yes  . Alcohol/week: 12.0 standard drinks  . Types: 12 Standard drinks or equivalent per week   Comment: occasional     Social History   Substance and Sexual Activity  Drug Use Yes  . Types: Marijuana    Social History   Socioeconomic History  . Marital status: Single    Spouse name: Not on file  . Number of children: Not on file  . Years of education: Not on file  . Highest education level: Not on file  Occupational History  . Not on file  Social Needs  . Financial resource strain: Not on file  . Food insecurity    Worry: Not on file    Inability: Not on file  . Transportation needs    Medical: Not on file    Non-medical: Not on file  Tobacco Use  . Smoking status: Current Every Day Smoker    Packs/day: 1.00    Types: Cigarettes  . Smokeless tobacco: Never Used  Substance and Sexual Activity  . Alcohol use: Yes    Alcohol/week: 12.0 standard drinks    Types: 12 Standard drinks or equivalent per week    Comment: occasional  . Drug use: Yes    Types: Marijuana  . Sexual activity: Not on file  Lifestyle  . Physical activity    Days per week: Not on file    Minutes per session: Not on file  . Stress: Not on file  Relationships  . Social Herbalist on phone: Not on file    Gets together: Not on file    Attends religious service: Not on file    Active member of club or organization: Not on file    Attends meetings of clubs or organizations: Not on file    Relationship status: Not on file  Other Topics Concern  . Not on file  Social History Narrative   ** Merged History Encounter **       Additional Social History:    Allergies:  No Known Allergies  Labs:  Results for orders placed or performed during the hospital encounter of 10/31/18 (from the past 48 hour(s))  TSH     Status: None   Collection Time: 11/01/18  5:19 AM  Result Value Ref Range    TSH 1.569 0.350 - 4.500 uIU/mL    Comment: Performed by a 3rd Generation assay with a functional sensitivity of <=0.01 uIU/mL. Performed at [facility], [place], [place] [address]   Glucose, capillary     Status: Abnormal   Collection Time: 11/01/18  1:35 PM  Result Value Ref Range   Glucose-Capillary 144 (H) 70 - 99 mg/dL  MRSA PCR Screening     Status: None   Collection Time: 11/01/18  1:37 PM   Specimen: Nasopharyngeal  Result Value Ref Range   MRSA  by PCR NEGATIVE NEGATIVE    Comment:        The GeneXpert MRSA Assay (FDA approved for NASAL specimens only), is one component of a comprehensive MRSA colonization surveillance program. It is not intended to diagnose MRSA infection nor to guide or monitor treatment for MRSA infections. Performed at [facility], [address] [place], [place] [address]   SARS CORONAVIRUS 2 (TAT 6-12 HRS) Nasal Swab Aptima Multi Swab     Status: None   Collection Time: 11/01/18  3:33 PM   Specimen: Aptima Multi Swab; Nasal Swab  Result Value Ref Range   SARS Coronavirus 2 NEGATIVE NEGATIVE    Comment: (NOTE) SARS-CoV-2 target nucleic acids are NOT DETECTED. The SARS-CoV-2 RNA is generally detectable in upper and lower respiratory specimens during the acute phase of infection. Negative results do not preclude SARS-CoV-2 infection, do not rule out co-infections with other pathogens, and should not be used as the sole basis for treatment or other patient management decisions. Negative results must be combined with clinical observations, patient history, and epidemiological information. The expected result is Negative. Fact Sheet for Patients: HairSlick.no Fact Sheet for Healthcare Providers: quierodirigir.com This test is not yet approved or cleared by the [place] FDA and  has been authorized for detection and/or diagnosis of SARS-CoV-2 by FDA  under an Emergency Use Authorization (EUA). This EUA will remain  in effect (meaning this test can be used) for the duration of the COVID-19 declaration under Section 56 4(b)(1) of the Act, 21 U.S.C. section 360bbb-3(b)(1), unless the authorization is terminated or revoked sooner. Performed at [facility], [address] [place], [place] [address]   Basic metabolic panel     Status: Abnormal   Collection Time: 11/01/18  3:45 PM  Result Value Ref Range   Sodium 140 135 - 145 mmol/L   Potassium 3.1 (L) 3.5 - 5.1 mmol/L   Chloride 104 98 - 111 mmol/L   CO2 27 22 - 32 mmol/L   Glucose, Bld 112 (H) 70 - 99 mg/dL   BUN 21 (H) 6 - 20 mg/dL   Creatinine, Ser 9.62 0.61 - 1.24 mg/dL   Calcium 9.1 8.9 - 95.2 mg/dL   GFR calc non Af Amer >60 >60 mL/min   GFR calc Af Amer >60 >60 mL/min   Anion gap 9 5 - 15    Comment: Performed at [facility], [address], [place], [place] [address]  Magnesium     Status: None   Collection Time: 11/01/18  3:45 PM  Result Value Ref Range   Magnesium 2.3 1.7 - 2.4 mg/dL    Comment: Performed at [facility], [address] [place], [place] [address]  Phosphorus     Status: None   Collection Time: 11/01/18  3:45 PM  Result Value Ref Range   Phosphorus 3.9 2.5 - 4.6 mg/dL    Comment: Performed at [facility], [address] [place], [place] [address]  Basic metabolic panel     Status: Abnormal   Collection Time: 11/02/18  4:49 AM  Result Value Ref Range   Sodium 141 135 - 145 mmol/L   Potassium 3.9 3.5 - 5.1 mmol/L   Chloride 108 98 - 111 mmol/L   CO2 24 22 - 32 mmol/L   Glucose, Bld 112 (H) 70 - 99 mg/dL   BUN 20 6 - 20 mg/dL   Creatinine, Ser 6.64 0.61 - 1.24 mg/dL   Calcium 9.0 8.9 -  10.3 mg/dL   GFR calc non Af Amer >60 >60 mL/min   GFR calc Af Amer >60 >60 mL/min   Anion gap 9 5 - 15    Comment: Performed at [facility], [address] [place], [place] [address]  Magnesium     Status: None    Collection Time: 11/02/18  4:49 AM  Result Value Ref Range   Magnesium 2.2 1.7 - 2.4 mg/dL    Comment: Performed at [facility], [address] [place], [place] [address]    Current Facility-Administered Medications  Medication Dose Route Frequency Provider Last Rate Last Dose  . acetaminophen (TYLENOL) tablet 650 mg  650 mg Oral Q6H PRN Arnaldo Nataliamond, Michael S, MD       Or  . acetaminophen (TYLENOL) suppository 650 mg  650 mg Rectal Q6H PRN Arnaldo Nataliamond, Michael S, MD      . albuterol (PROVENTIL) (2.5 MG/3ML) 0.083% nebulizer solution 2.5 mg  2.5 mg Nebulization Q4H PRN Arnaldo Nataliamond, Michael S, MD      . Chlorhexidine Gluconate Cloth 2 % PADS 6 each  6 each Topical Daily Vida RiggerAleskerov, Fuad, MD   6 each at 11/02/18 0902  . diphenhydrAMINE (BENADRYL) injection 50 mg  50 mg Intravenous Q6H Vida RiggerAleskerov, Fuad, MD   50 mg at 11/02/18 0212  . enoxaparin (LOVENOX) injection 40 mg  40 mg Subcutaneous Q24H Arnaldo Nataliamond, Michael S, MD   40 mg at 11/02/18 0901  . famotidine (PEPCID) tablet 20 mg  20 mg Oral BID Ellington, Abby K, RPH      . haloperidol (HALDOL) 2 MG/ML solution 5 mg  5 mg Oral Q6H PRN Charm RingsLord, Jamison Y, NP      . haloperidol lactate (HALDOL) injection 5 mg  5 mg Intravenous Q6H PRN Charm RingsLord, Jamison Y, NP      . LORazepam (ATIVAN) injection 2 mg  2 mg Intramuscular Once Vida RiggerAleskerov, Fuad, MD      . LORazepam (ATIVAN) injection 2-3 mg  2-3 mg Intravenous Q1H PRN Vida RiggerAleskerov, Fuad, MD   3 mg at 11/01/18 1430  . nicotine (NICODERM CQ - dosed in mg/24 hours) patch 14 mg  14 mg Transdermal Daily Shaune Pollackhen, Qing, MD   14 mg at 11/02/18 0856  . ondansetron (ZOFRAN) tablet 4 mg  4 mg Oral Q6H PRN Arnaldo Nataliamond, Michael S, MD       Or  . ondansetron [facility](ZOFRAN) injection 4 mg  4 mg Intravenous Q6H PRN Arnaldo Nataliamond, Michael S, MD      . sodium chloride 0.9 % 1,000 mL with thiamine 100 mg, folic acid 1 mg, multivitamins adult 10 mL infusion   Intravenous Continuous Vida RiggerAleskerov, Fuad, MD 40 mL/hr at 11/02/18 1600       Musculoskeletal: Strength & Muscle Tone: within normal limits Gait & Station: normal Patient leans: N/A  Psychiatric Specialty Exam: Physical Exam  Nursing note and vitals reviewed. Constitutional: He is oriented to person, place, and time. He appears well-developed and well-nourished.  Cardiovascular: Normal rate.  Respiratory: Effort normal.  Musculoskeletal: Normal range of motion.  Neurological: He is alert and oriented to person, place, and time.    Review of Systems  Constitutional: Negative.   HENT: Negative.   Eyes: Negative.   Respiratory: Negative.   Cardiovascular: Negative.   Gastrointestinal: Negative.   Genitourinary: Negative.   Musculoskeletal: Negative.   Skin: Negative.   Neurological: Negative.   Endo/Heme/Allergies: Negative.   Psychiatric/Behavioral: Positive for substance abuse.    Blood pressure 131/74, pulse (!) 57, temperature 98.7  F (37.1 C), temperature source Axillary, resp. rate (!) 31, height 6' (1.829 m), weight 68.7 kg, SpO2 100 %.Body mass index is 20.54 kg/m.  General Appearance: Disheveled  Eye Contact:  Good  Speech:  Clear and Coherent and Normal Rate  Volume:  Normal  Mood:  Euthymic  Affect:  Congruent  Thought Process:  Coherent and Descriptions of Associations: Intact  Orientation:  Full (Time, Place, and Person)  Thought Content:  WDL  Suicidal Thoughts:  No  Homicidal Thoughts:  No  Memory:  Immediate;   Good Recent;   Poor Remote;   Fair  Judgement:  Fair  Insight:  Fair  Psychomotor Activity:  Normal  Concentration:  Concentration: Good  Recall:  Good  Fund of Knowledge:  Good  Language:  Good  Akathisia:  No  Handed:  Right  AIMS (if indicated):     Assets:  Communication Skills Desire for Improvement Housing Resilience Social Support Transportation  ADL's:  Intact  Cognition:  WNL  Sleep:        Treatment Plan Summary: Reasess tomorrow  Continue CIWA protocol  Disposition: Observe overnight  and reassess tomorrow  Maryfrances Bunnellravis B Zerick Prevette, FNP 11/02/2018 5:17 PM

## 2018-11-02 NOTE — Consult Note (Signed)
CRITICAL CARE NOTE      CHIEF COMPLAINT:   Acute alcohol and benzodiazepine withdrawal syndrome   Subjective    Patient is hemodynamically stable. He was evaluated by psychiatry and seems tremendously improved from yesteday. We will optimize for downgrade to SDU and transition to medical floor.   PAST MEDICAL HISTORY   Past Medical History:  Diagnosis Date   Alcohol problem drinking    Arthritis    Asthma    GERD (gastroesophageal reflux disease)      SURGICAL HISTORY   History reviewed. No pertinent surgical history.   FAMILY HISTORY   Family History  Problem Relation Age of Onset   Drug abuse Other        Parent, grandparent   Arthritis Other    Heart disease Other        Grandparent   Sudden death Other        Grandparent     SOCIAL HISTORY   Social History   Tobacco Use   Smoking status: Current Every Day Smoker    Packs/day: 1.00    Types: Cigarettes   Smokeless tobacco: Never Used  Substance Use Topics   Alcohol use: Yes    Alcohol/week: 12.0 standard drinks    Types: 12 Standard drinks or equivalent per week    Comment: occasional   Drug use: Yes    Types: Marijuana     MEDICATIONS   Current Medication:  Current Facility-Administered Medications:    acetaminophen (TYLENOL) tablet 650 mg, 650 mg, Oral, Q6H PRN **OR** acetaminophen (TYLENOL) suppository 650 mg, 650 mg, Rectal, Q6H PRN, Arnaldo Nataliamond, Michael S, MD   albuterol (PROVENTIL) (2.5 MG/3ML) 0.083% nebulizer solution 2.5 mg, 2.5 mg, Nebulization, Q4H PRN, Arnaldo Nataliamond, Michael S, MD   Chlorhexidine Gluconate Cloth 2 % PADS 6 each, 6 each, Topical, Daily, Vida RiggerAleskerov, Antawn Sison, MD, 6 each at 11/02/18 0902   dexmedetomidine (PRECEDEX) 400 MCG/100ML (4 mcg/mL) infusion, 0-1.2 mcg/kg/hr, Intravenous, Continuous,  Vida RiggerAleskerov, Elizet Kaplan, MD, Stopped at 11/02/18 0520   diphenhydrAMINE (BENADRYL) injection 50 mg, 50 mg, Intravenous, Q6H, Vida RiggerAleskerov, Sanaya Gwilliam, MD, 50 mg at 11/02/18 0212   enoxaparin (LOVENOX) injection 40 mg, 40 mg, Subcutaneous, Q24H, Arnaldo Nataliamond, Michael S, MD, 40 mg at 11/02/18 0901   famotidine (PEPCID) IVPB 20 mg premix, 20 mg, Intravenous, Q12H, Vida RiggerAleskerov, Lorijean Husser, MD, Stopped at 11/02/18 [id number]   haloperidol (HALDOL) 2 MG/ML solution 5 mg, 5 mg, Oral, Q6H PRN, Charm RingsLord, Jamison Y, NP   haloperidol lactate (HALDOL) injection 5 mg, 5 mg, Intravenous, Q6H PRN, Charm RingsLord, Jamison Y, NP   HYDROmorphone (DILAUDID) injection 2 mg, 2 mg, Intravenous, Q6H, Vida RiggerAleskerov, Gwenna Fuston, MD, 2 mg at 11/02/18 [id number]   LORazepam (ATIVAN) injection 2 mg, 2 mg, Intramuscular, Once, Vida RiggerAleskerov, Dimas Scheck, MD   LORazepam (ATIVAN) injection 2-3 mg, 2-3 mg, Intravenous, Q1H PRN, Vida RiggerAleskerov, Thelda Gagan, MD, 3 mg at 11/01/18 1430   nicotine (NICODERM CQ - dosed in mg/24 hours) patch 14 mg, 14 mg, Transdermal, Daily, Shaune Pollackhen, Qing, MD, 14 mg at 11/02/18 0856   ondansetron (ZOFRAN) tablet 4 mg, 4 mg, Oral, Q6H PRN **OR** ondansetron (ZOFRAN) injection 4 mg, 4 mg, Intravenous, Q6H PRN, Arnaldo Nataliamond, Michael S, MD   sodium chloride 0.9 % 1,000 mL with thiamine 100 mg, folic acid 1 mg, multivitamins adult 10 mL infusion, , Intravenous, Continuous, Klaudia Beirne, MD, Last Rate: 40 mL/hr at 11/02/18 1000    ALLERGIES   Patient has no known allergies.    REVIEW OF SYSTEMS     Unable to obtain as  patient is not appropriate to answer any questions due to psychosis with aggresion secondary to withdrawal  PHYSICAL EXAMINATION   Vitals:   11/02/18 0900 11/02/18 1000  BP: (!) 141/92 (!) 145/94  Pulse: (!) 49 79  Resp: (!) 21 (!) 28  Temp:    SpO2: 100% 99%    GENERAL:severe aggitation HEAD: Normocephalic, atraumatic.  EYES: Pupils equal, round, reactive to light.  No scleral icterus.  MOUTH: Moist mucosal membrane. NECK: Supple. No thyromegaly. No  nodules. No JVD.  PULMONARY: CTAB CARDIOVASCULAR: S1 and S2. Regular rate and rhythm. No murmurs, rubs, or gallops.  GASTROINTESTINAL: Soft, nontender, non-distended. No masses. Positive bowel sounds. No hepatosplenomegaly.  MUSCULOSKELETAL: No swelling, clubbing, or edema.  NEUROLOGIC: Extremely aggitated SKIN:intact,warm,dry   PERTINENT DATA      Lines / Drains: PIV  Cultures / Sepsis markers: none  Antibiotics: none   Protocols / Consultants: Psychiatry evaluation  Tests / Events: none     Infusions:  dexmedetomidine (PRECEDEX) IV infusion Stopped (11/02/18 0520)   famotidine (PEPCID) IV Stopped (11/02/18 0923)   banana bag IV 1000 mL 40 mL/hr at 11/02/18 1000   Scheduled Medications:  Chlorhexidine Gluconate Cloth  6 each Topical Daily   diphenhydrAMINE  50 mg Intravenous Q6H   enoxaparin (LOVENOX) injection  40 mg Subcutaneous Q24H    HYDROmorphone (DILAUDID) injection  2 mg Intravenous Q6H   LORazepam  2 mg Intramuscular Once   nicotine  14 mg Transdermal Daily   PRN Medications: acetaminophen **OR** acetaminophen, albuterol, haloperidol, haloperidol lactate, LORazepam, ondansetron **OR** ondansetron (ZOFRAN) IV Hemodynamic parameters:   Intake/Output: 08/24 0701 - 08/25 0700 In: 2020.3 [P.O.:480; I.V.:994.8; IV Piggyback:545.5] Out: 200 [Urine:200]  Ventilator  Settings:      LAB RESULTS:  Basic Metabolic Panel: Recent Labs  Lab 10/29/18 1711 10/31/18 1413 11/01/18 1545 11/02/18 0449  NA 130* 136 140 141  K 2.9* 3.0* 3.1* 3.9  CL 82* 100 104 108  CO2 28 26 27 24   GLUCOSE 118* 137* 112* 112*  BUN 34* 22* 21* 20  CREATININE 0.97 0.48* 0.71 0.70  CALCIUM 10.6* 9.3 9.1 9.0  MG 2.5*  --  2.3 2.2  PHOS  --   --  3.9  --    Liver Function Tests: Recent Labs  Lab 10/29/18 1711 10/31/18 1413  AST 17 14*  ALT 28 18  ALKPHOS 82 67  BILITOT 2.3* 1.0  PROT 8.9* 7.2  ALBUMIN 5.7* 4.4   Recent Labs  Lab 10/29/18 1711  10/31/18 1534  LIPASE 18 30   Recent Labs  Lab 10/31/18 1534  AMMONIA 22   CBC: Recent Labs  Lab 10/29/18 1711 10/31/18 1413  WBC 21.8*   21.8* 12.8*  NEUTROABS 17.2*  --   HGB 18.5*   18.4* 15.3  HCT 50.0   50.1 43.3  MCV 79.6*   79.5* 82.5  PLT 403*   402* 275   Cardiac Enzymes: No results for input(s): CKTOTAL, CKMB, CKMBINDEX, TROPONINI in the last 168 hours. BNP: Invalid input(s): POCBNP CBG: Recent Labs  Lab 11/01/18 1335  GLUCAP 144*     IMAGING RESULTS:  Imaging: Ct Head Wo Contrast  Result Date: 10/31/2018 CLINICAL DATA:  Altered level of consciousness EXAM: CT HEAD WITHOUT CONTRAST TECHNIQUE: Contiguous axial images were obtained from the base of the skull through the vertex without intravenous contrast. COMPARISON:  10/29/2018 FINDINGS: Brain: No acute intracranial abnormality. Specifically, no hemorrhage, hydrocephalus, mass lesion, acute infarction, or significant intracranial injury. Vascular: No hyperdense vessel  or unexpected calcification. Skull: No acute calvarial abnormality. Sinuses/Orbits: Visualized paranasal sinuses and mastoids clear. Orbital soft tissues unremarkable. Other: Small amount of soft tissue gas again noted near the right skull base, decreased since prior study related to prior pneumomediastinum. IMPRESSION: No acute intracranial abnormality. Improving soft tissue gas near the skull base. Electronically Signed   By: Charlett NoseKevin  Dover M.D.   On: 10/31/2018 16:07   Dg Chest Portable 1 View  Result Date: 10/31/2018 CLINICAL DATA:  Pneumomediastinum EXAM: PORTABLE CHEST 1 VIEW COMPARISON:  10/29/2018 FINDINGS: Previously seen pneumomediastinum by plain film and CT not definitively seen on today's study. Pneumomediastinum was easier to visualize on prior CT. No pneumothorax. Lungs are clear. Heart is normal size. No effusions. IMPRESSION: No visible pneumomediastinum or pneumothorax. Electronically Signed   By: Charlett NoseKevin  Dover M.D.   On: 10/31/2018  15:53      ASSESSMENT AND PLAN    -Multidisciplinary rounds held today  Acute Alcohol and benzodiazepine withdrawal  - folate/thiamine infusion  - CIWA protocol   - high tolerance of drugs , no response post multiple doses of Ativan - psychiatry consultation - appreciate input - discussed case with psychiatry provider today and care plan formulated    GI/Nutrition GI PROPHYLAXIS as indicated DIET-->TF's as tolerated Constipation protocol as indicated  ENDO - ICU hypoglycemic\Hyperglycemia protocol -check FSBS per protocol   ELECTROLYTES -follow labs as needed -replace as needed -pharmacy consultation   DVT/GI PRX ordered -SCDs  TRANSFUSIONS AS NEEDED MONITOR FSBS ASSESS the need for LABS as needed   Critical care provider statement:    Critical care time (minutes):  32   Critical care time was exclusive of:  Separately billable procedures and treating other patients   Critical care was necessary to treat or prevent imminent or life-threatening deterioration of the following conditions:  acute drug withdrawal, acute alcohol withdrawal, asthma , dm, GERD, electrolyte impairment.    Critical care was time spent personally by me on the following activities:  Development of treatment plan with patient or surrogate, discussions with consultants, evaluation of patient's response to treatment, examination of patient, obtaining history from patient or surrogate, ordering and performing treatments and interventions, ordering and review of laboratory studies and re-evaluation of patient's condition.  I assumed direction of critical care for this patient from another provider in my specialty: no    This document was prepared using Dragon voice recognition software and may include unintentional dictation errors.    Vida RiggerFuad Julliette Frentz, M.D.  Division of Pulmonary & Critical Care Medicine  [facility]

## 2018-11-02 NOTE — Plan of Care (Signed)
Pt w/ VSS on room air, awakens easily and is appropriate while awake, answers questions correctly and promptly, has no memory of yesterday and says the behavior reported to him "does not sound like me."  He continues to sleep between care. Mother at bedside.  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  He was extremely sick w/ n/v a few days ago and then came to [facility] ED, was transferred to [facility], who kept him < 24 hours, DCd him despite continuing odd behavior brought up by his mother.  This is why she brought him back to [facility] so soon.  Pt does not remember taking any unknown substances before he was brought to [facility], states he "May have taken one xanax" but he is not sure.  His mother knows he takes Xanax but she says "he does not actually have a prescription for that" so she does not know where he gets them.  He states he uses marijuana daily, but uses a "wax" so it can't be "messed with."

## 2018-11-02 NOTE — Progress Notes (Signed)
Pharmacy Electrolyte Monitoring Consult:  Pharmacy consulted to assist in monitoring and replacing electrolytes in this 23 y.o. male admitted on 10/31/2018 with acute alcohol withdrawal and benzodiazepine withdrawal. Patient has history of alcoholism, asthma, arthritis, and GERD.   Labs:  Sodium (mmol/L)  Date Value  11/02/2018 141   Potassium (mmol/L)  Date Value  11/02/2018 3.9   Magnesium (mg/dL)  Date Value  11/02/2018 2.2   Phosphorus (mg/dL)  Date Value  11/01/2018 3.9   Calcium (mg/dL)  Date Value  11/02/2018 9.0   Albumin (g/dL)  Date Value  10/31/2018 4.4    Assessment/Plan: Patient is now able to take PO medications, but still on banana bag 40 mL/hr.   Electrolytes WNL and do not warrant replacement at this time.   BMP/Magnesium with am labs.   Will replace to maintain potassium 3.6-4 and magnesium ~ 2.   Pharmacy will continue to monitor and adjust per consult.   Sarahi Borland Dear Nicholes Mango 11/02/2018 2:31 PM

## 2018-11-03 LAB — BASIC METABOLIC PANEL
Anion gap: 11 (ref 5–15)
BUN: 13 mg/dL (ref 6–20)
CO2: 24 mmol/L (ref 22–32)
Calcium: 9.1 mg/dL (ref 8.9–10.3)
Chloride: 105 mmol/L (ref 98–111)
Creatinine, Ser: 0.62 mg/dL (ref 0.61–1.24)
GFR calc Af Amer: >60 mL/min (ref >60)
GFR calc non Af Amer: >60 mL/min (ref >60)
Glucose, Bld: 98 mg/dL (ref 70–99)
Potassium: 3.3 mmol/L — ABNORMAL LOW (ref 3.5–5.1)
Sodium: 140 mmol/L (ref 135–145)

## 2018-11-03 LAB — MAGNESIUM: Magnesium: 2.2 mg/dL (ref 1.7–2.4)

## 2018-11-03 MED ORDER — POTASSIUM CHLORIDE CRYS ER 20 MEQ PO TBCR
30.0000 meq | EXTENDED_RELEASE_TABLET | Freq: Once | ORAL | Status: AC
  Administered 2018-11-03: 06:00:00 30 meq via ORAL
  Filled 2018-11-03: qty 2

## 2018-11-03 NOTE — Progress Notes (Signed)
Pt complained of inability to void and pressure in lower abdomen. Bladder scan revealed greater that 900 in bladder. I&O cath performed with 800 ml of urine return. Residual of 160 left in bladder. Pt stated he felt relief

## 2018-11-03 NOTE — Plan of Care (Signed)
IVC rescinded by psych NP, pt dicharged to home with his mother by Dr Bridgett Larsson.  All Dc instructions, scrips and f/us given to mother, understanding verbalized.  IV site DCd, bleeding stopped.  VSS on room air, pt alert and oriented and eager to leave the hospital.  Pt left hospital in car with his mother

## 2018-11-03 NOTE — Clinical Social Work Note (Signed)
Patient left hospital before CSW could provide substance abuse resources. Tried calling patient to see if they could be emailed. No answer and voicemail is not set up.  Dayton Scrape, [place]

## 2018-11-03 NOTE — Discharge Instructions (Signed)
Smoking cessation  

## 2018-11-03 NOTE — Consult Note (Signed)
[facility]   Reason for Consult:  Benzo withdrawal Referring Physician:  EDP Patient Identification: Ryan BarrJeffrey Tylar Sear MRN:  [id number] Principal Diagnosis: Benzodiazepine withdrawal (HCC) Diagnosis:  Principal Problem:   Benzodiazepine withdrawal (HCC) Active Problems:   Delirium tremens (HCC)   Total Time spent with patient: 30 minutes  Subjective:   Ryan Booth is a 23 y.o. male patient reports that his mood has improved greatly today.  Patient continues to deny any suicidal homicidal ideations and denies any hallucinations.  Patient reports that he knows he needs to quit using drugs and that staying with his mom will help with that.  He states that she would kick him out and he would be homeless if he does not stop using drugs.  Patient also states that he needs to stop using drugs so he can take care of his 24-year-old daughter and be there for her.  He states that he wants to do better and is agreed to go to outpatient therapy and counseling for substance abuse.  Patient's mother is at his bedside again.  She reports that she feels safe with the patient going home and she has no safety concerns at this time.  She states that they have discussed with him remaining drug-free in order to stay in the house with them.  She states that she feels that she is ready to take him home today.  HPI:  Per Dr Marisa SeverinSiadecki: 23 y.o. male with PMH as noted below who presents with altered mental status over the last several days, gradual onset, and worsening today.  The patient initially was seen in the ED 2 days ago with persistent nausea and vomiting.  Work-up revealed findings concerning for pneumomediastinum and the patient was transferred [facility] for further evaluation.  He apparently was discharged yesterday.  The mother states that since that time he has been increasingly confused, although he was already somewhat confused during the admission there.   The mother reports that the patient is answering questions that nobody is asked him, talking to himself and to walls, and making no sense. She reports that he has a history of marijuana use and there is also some concern about possibly abusing Xanax.  With the mother out of the room, the patient denies both of these.  The mother also reports that the patient had an episode in which he was involuntarily committed with depression and suicidal ideation around the age of 23 but does not have other psychiatric history.   11/03/18: Patient is seen by this provider face-to-face.  Patient is sitting in the bed and has remained calm, cooperative, and pleasant.  There was some concern about the patient not having any urinary output but after people which stepped out of the room he was able to urinate adequately.  Patient has continued to deny any suicidal homicidal ideations and has agreed to go to therapy or counseling for follow-up for his substance abuse.  Patient's mother is at his bedside again and she is in agreement for patient going to follow-up after his discharge.  She also agrees that the patient is safe for discharge home and she has no safety concerns at this time.  At this time the patient does not meet inpatient criteria and is psychiatrically cleared.  I have notified Dr. Claudie Fishermanhin who has taken over for this patient.  I have rescinded the patient's IVC.  Unit social worker has been notified to provide patient with outpatient resources.  11/02/18:  Patient is seen by this provider face-to-face.  Patient is calm, cooperative, and pleasant.  Based off the report it appears there is the patient is experiencing benzodiazepine withdrawal.  There is no history of psychiatric issues except for some aggression when he was a younger child and was placed that behavioral health in [place].  Feel that patient needs to be monitored overnight to ensure that there are no other issues.  Patient has continually denied any  suicidal or homicidal ideations and denies any hallucinations.  After reassessment tomorrow we will decide whether patient meets inpatient criteria or not, but at this time he does not.  Past Psychiatric History: Aggression as a child  Risk to Self:   Risk to Others:   Prior Inpatient Therapy:   Prior Outpatient Therapy:    Past Medical History:  Past Medical History:  Diagnosis Date  . Alcohol problem drinking   . Arthritis   . Asthma   . GERD (gastroesophageal reflux disease)    History reviewed. No pertinent surgical history. Family History:  Family History  Problem Relation Age of Onset  . Drug abuse Other        Parent, grandparent  . Arthritis Other   . Heart disease Other        Grandparent  . Sudden death Other        Grandparent   Family Psychiatric  History: None reported Social History:  Social History   Substance and Sexual Activity  Alcohol Use Yes  . Alcohol/week: 12.0 standard drinks  . Types: 12 Standard drinks or equivalent per week   Comment: occasional     Social History   Substance and Sexual Activity  Drug Use Yes  . Types: Marijuana    Social History   Socioeconomic History  . Marital status: Single    Spouse name: Not on file  . Number of children: Not on file  . Years of education: Not on file  . Highest education level: Not on file  Occupational History  . Not on file  Social Needs  . Financial resource strain: Not on file  . Food insecurity    Worry: Not on file    Inability: Not on file  . Transportation needs    Medical: Not on file    Non-medical: Not on file  Tobacco Use  . Smoking status: Current Every Day Smoker    Packs/day: 1.00    Types: Cigarettes  . Smokeless tobacco: Never Used  Substance and Sexual Activity  . Alcohol use: Yes    Alcohol/week: 12.0 standard drinks    Types: 12 Standard drinks or equivalent per week    Comment: occasional  . Drug use: Yes    Types: Marijuana  . Sexual activity: Not on file   Lifestyle  . Physical activity    Days per week: Not on file    Minutes per session: Not on file  . Stress: Not on file  Relationships  . Social Musicianconnections    Talks on phone: Not on file    Gets together: Not on file    Attends religious service: Not on file    Active member of club or organization: Not on file    Attends meetings of clubs or organizations: Not on file    Relationship status: Not on file  Other Topics Concern  . Not on file  Social History Narrative   ** Merged History Encounter **       Additional Social History:  Allergies:  No Known Allergies  Labs:  Results for orders placed or performed during the hospital encounter of 10/31/18 (from the past 48 hour(s))  Glucose, capillary     Status: Abnormal   Collection Time: 11/01/18  1:35 PM  Result Value Ref Range   Glucose-Capillary 144 (H) 70 - 99 mg/dL  MRSA PCR Screening     Status: None   Collection Time: 11/01/18  1:37 PM   Specimen: Nasopharyngeal  Result Value Ref Range   MRSA by PCR NEGATIVE NEGATIVE    Comment:        The GeneXpert MRSA Assay (FDA approved for NASAL specimens only), is one component of a comprehensive MRSA colonization surveillance program. It is not intended to diagnose MRSA infection nor to guide or monitor treatment for MRSA infections. Performed at [facility], [place], [place] [address]   SARS CORONAVIRUS 2 (TAT 6-12 HRS) Nasal Swab Aptima Multi Swab     Status: None   Collection Time: 11/01/18  3:33 PM   Specimen: Aptima Multi Swab; Nasal Swab  Result Value Ref Range   SARS Coronavirus 2 NEGATIVE NEGATIVE    Comment: (NOTE) SARS-CoV-2 target nucleic acids are NOT DETECTED. The SARS-CoV-2 RNA is generally detectable in upper and lower respiratory specimens during the acute phase of infection. Negative results do not preclude SARS-CoV-2 infection, do not rule out co-infections with other pathogens, and should not be used as the sole  basis for treatment or other patient management decisions. Negative results must be combined with clinical observations, patient history, and epidemiological information. The expected result is Negative. Fact Sheet for Patients: SugarRoll.be Fact Sheet for Healthcare Providers: https://www.woods-mathews.com/ This test is not yet approved or cleared by the [place] FDA and  has been authorized for detection and/or diagnosis of SARS-CoV-2 by FDA under an Emergency Use Authorization (EUA). This EUA will remain  in effect (meaning this test can be used) for the duration of the COVID-19 declaration under Section 56 4(b)(1) of the Act, 21 U.S.C. section 360bbb-3(b)(1), unless the authorization is terminated or revoked sooner. Performed at [facility], [place] [address] [place], [place] [address]   Basic metabolic panel     Status: Abnormal   Collection Time: 11/01/18  3:45 PM  Result Value Ref Range   Sodium 140 135 - 145 mmol/L   Potassium 3.1 (L) 3.5 - 5.1 mmol/L   Chloride 104 98 - 111 mmol/L   CO2 27 22 - 32 mmol/L   Glucose, Bld 112 (H) 70 - 99 mg/dL   BUN 21 (H) 6 - 20 mg/dL   Creatinine, Ser 0.71 0.61 - 1.24 mg/dL   Calcium 9.1 8.9 - 10.3 mg/dL   GFR calc non Af Amer >60 >60 mL/min   GFR calc Af Amer >60 >60 mL/min   Anion gap 9 5 - 15    Comment: Performed at [facility], [address], [place], [place] [address]  Magnesium     Status: None   Collection Time: 11/01/18  3:45 PM  Result Value Ref Range   Magnesium 2.3 1.7 - 2.4 mg/dL    Comment: Performed at [facility], [address], [place], [place] [address]  Phosphorus     Status: None   Collection Time: 11/01/18  3:45 PM  Result Value Ref Range   Phosphorus 3.9 2.5 - 4.6 mg/dL    Comment: Performed at [facility], [address] [place], [place] [address]  Basic  metabolic panel     Status: Abnormal   Collection Time: 11/02/18  4:49  AM  Result Value Ref Range   Sodium 141 135 - 145 mmol/L   Potassium 3.9 3.5 - 5.1 mmol/L   Chloride 108 98 - 111 mmol/L   CO2 24 22 - 32 mmol/L   Glucose, Bld 112 (H) 70 - 99 mg/dL   BUN 20 6 - 20 mg/dL   Creatinine, Ser 9.73 0.61 - 1.24 mg/dL   Calcium 9.0 8.9 - 53.2 mg/dL   GFR calc non Af Amer >60 >60 mL/min   GFR calc Af Amer >60 >60 mL/min   Anion gap 9 5 - 15    Comment: Performed at [facility], [address], [place], [place] [address]  Magnesium     Status: None   Collection Time: 11/02/18  4:49 AM  Result Value Ref Range   Magnesium 2.2 1.7 - 2.4 mg/dL    Comment: Performed at [facility], [address] [place], [place] [address]  Basic metabolic panel     Status: Abnormal   Collection Time: 11/03/18  4:00 AM  Result Value Ref Range   Sodium 140 135 - 145 mmol/L   Potassium 3.3 (L) 3.5 - 5.1 mmol/L   Chloride 105 98 - 111 mmol/L   CO2 24 22 - 32 mmol/L   Glucose, Bld 98 70 - 99 mg/dL   BUN 13 6 - 20 mg/dL   Creatinine, Ser 9.62 0.61 - 1.24 mg/dL   Calcium 9.1 8.9 - 22.9 mg/dL   GFR calc non Af Amer >60 >60 mL/min   GFR calc Af Amer >60 >60 mL/min   Anion gap 11 5 - 15    Comment: Performed at [facility], [address], [place], [place] [address]  Magnesium     Status: None   Collection Time: 11/03/18  4:00 AM  Result Value Ref Range   Magnesium 2.2 1.7 - 2.4 mg/dL    Comment: Performed at [facility], [address] [place], [place] [address]    Current Facility-Administered Medications  Medication Dose Route Frequency Provider Last Rate Last Dose  . acetaminophen (TYLENOL) tablet 650 mg  650 mg Oral Q6H PRN Arnaldo Natal, MD       Or  . acetaminophen (TYLENOL) suppository 650 mg  650 mg Rectal Q6H PRN Arnaldo Natal, MD      . albuterol (PROVENTIL) (2.5 MG/3ML) 0.083% nebulizer solution 2.5 mg  2.5 mg Nebulization Q4H PRN Arnaldo Natal, MD      . Chlorhexidine Gluconate Cloth 2 % PADS 6 each  6  each Topical Daily Vida Rigger, MD   6 each at 11/03/18 [id number]  . diphenhydrAMINE (BENADRYL) injection 50 mg  50 mg Intravenous Q6H Vida Rigger, MD   50 mg at 11/02/18 0212  . enoxaparin (LOVENOX) injection 40 mg  40 mg Subcutaneous Q24H Arnaldo Natal, MD   40 mg at 11/03/18 0843  . famotidine (PEPCID) tablet 20 mg  20 mg Oral BID Pricilla Riffle, RPH   20 mg at 11/03/18 0841  . haloperidol (HALDOL) 2 MG/ML solution 5 mg  5 mg Oral Q6H PRN Charm Rings, NP      . haloperidol lactate (HALDOL) injection 5 mg  5 mg Intravenous Q6H PRN Charm Rings, NP      . LORazepam (ATIVAN) injection 2 mg  2 mg Intramuscular Once Vida Rigger, MD      .  LORazepam (ATIVAN) injection 2-3 mg  2-3 mg Intravenous Q1H PRN Vida Rigger, MD   3 mg at 11/01/18 1430  . nicotine (NICODERM CQ - dosed in mg/24 hours) patch 14 mg  14 mg Transdermal Daily Shaune Pollack, MD   14 mg at 11/03/18 0841  . ondansetron (ZOFRAN) tablet 4 mg  4 mg Oral Q6H PRN Arnaldo Natal, MD       Or  . ondansetron [facility]) injection 4 mg  4 mg Intravenous Q6H PRN Arnaldo Natal, MD      . sodium chloride 0.9 % 1,000 mL with thiamine 100 mg, folic acid 1 mg, multivitamins adult 10 mL infusion   Intravenous Continuous Vida Rigger, MD   Stopped at 11/02/18 2128    Musculoskeletal: Strength & Muscle Tone: within normal limits Gait & Station: normal Patient leans: N/A  Psychiatric Specialty Exam: Physical Exam  Nursing note and vitals reviewed. Constitutional: He is oriented to person, place, and time. He appears well-developed and well-nourished.  Cardiovascular: Normal rate.  Respiratory: Effort normal.  Musculoskeletal: Normal range of motion.  Neurological: He is alert and oriented to person, place, and time.    Review of Systems  Constitutional: Negative.   HENT: Negative.   Eyes: Negative.   Respiratory: Negative.   Cardiovascular: Negative.   Gastrointestinal: Negative.   Genitourinary: Negative.    Musculoskeletal: Negative.   Skin: Negative.   Neurological: Negative.   Endo/Heme/Allergies: Negative.   Psychiatric/Behavioral: Positive for substance abuse.    Blood pressure (!) 144/99, pulse 97, temperature 98.6 F (37 C), temperature source Oral, resp. rate 12, height 6' (1.829 m), weight 68.7 kg, SpO2 99 %.Body mass index is 20.54 kg/m.  General Appearance: Casual  Eye Contact:  Good  Speech:  Clear and Coherent and Normal Rate  Volume:  Normal  Mood:  Euthymic  Affect:  Congruent  Thought Process:  Coherent and Descriptions of Associations: Intact  Orientation:  Full (Time, Place, and Person)  Thought Content:  WDL  Suicidal Thoughts:  No  Homicidal Thoughts:  No  Memory:  Immediate;   Good Recent;   Poor Remote;   Fair  Judgement:  Fair  Insight:  Fair  Psychomotor Activity:  Normal  Concentration:  Concentration: Good  Recall:  Good  Fund of Knowledge:  Good  Language:  Good  Akathisia:  No  Handed:  Right  AIMS (if indicated):     Assets:  Communication Skills Desire for Improvement Housing Resilience Social Support Transportation  ADL's:  Intact  Cognition:  WNL  Sleep:        Treatment Plan Summary: Seek therapy and counseling  Cessation of any illicit or unprescribed drugs  Disposition: No evidence of imminent risk to self or others at present.   Patient does not meet criteria for psychiatric inpatient admission. Discussed crisis plan, support from social network, calling 911, coming to the Emergency Department, and calling Suicide Hotline.  Gerlene Burdock Jathan Balling, FNP 11/03/2018 10:44 AM

## 2018-11-03 NOTE — Discharge Summary (Signed)
[facility] [place] [facility]   PATIENT NAME: Ryan EvesJeffrey Booth    MR#:  [id number]  DATE OF BIRTH:  12/24/1995  DATE OF ADMISSION:  10/31/2018   ADMITTING PHYSICIAN: Arnaldo NatalMichael S Diamond, MD  DATE OF DISCHARGE: 11/03/2018 11:35 AM  PRIMARY CARE PHYSICIAN: Patient, No Pcp Per   ADMISSION DIAGNOSIS:  Delirium tremens (HCC) [F10.231] Altered mental status, unspecified altered mental status type [R41.82] DISCHARGE DIAGNOSIS:  Principal Problem:   Benzodiazepine withdrawal (HCC) Active Problems:   Delirium tremens (HCC)  SECONDARY DIAGNOSIS:   Past Medical History:  Diagnosis Date  . Alcohol problem drinking   . Arthritis   . Asthma   . GERD (gastroesophageal reflux disease)    HOSPITAL COURSE:  This is a 23 year old male admitted forbenzodiazepinewithdrawal. 1. Withdrawal symptoms:  He was CIWA protocol and on IVC. Seizure precautions.  Haldol5mg  every six hours prn until agitation resolves and Recommend psychiatric  The patient is off sedation resolved agitation.  He is alert awake orientated.  He has no complaints.  No anxiety or depression or tremor.  Psychiatry nurse practitioner discontinued IVC and agree to discharge home.  2. Hypokalemia: Improved with supplement.  Magnesium is normal. 3. SIRS: No indication of infectious source. Symptoms secondary to withdrawal.   Improved.   4. Asthma: Controlled; albuterol as needed I discussed with psychiatry nurse practitioner. DISCHARGE CONDITIONS:  Stable, discharge to home today. CONSULTS OBTAINED:  Treatment Team:  [facility], [facility], MD DRUG ALLERGIES:  No Known Allergies DISCHARGE MEDICATIONS:   Allergies as of 11/03/2018   No Known Allergies     Medication List    STOP taking these medications   fluticasone 50 MCG/ACT nasal spray Commonly known as: FLONASE   ibuprofen 800 MG tablet Commonly known as: ADVIL     TAKE these medications   albuterol 108 (90 Base) MCG/ACT inhaler  Commonly known as: VENTOLIN HFA Inhale 1-2 puffs into the lungs every 6 (six) hours as needed for wheezing or shortness of breath.   fexofenadine 180 MG tablet Commonly known as: ALLEGRA Take 180 mg by mouth daily.   omeprazole 20 MG capsule Commonly known as: PRILOSEC Take 20 mg by mouth daily.        DISCHARGE INSTRUCTIONS:  See AVS.  If you experience worsening of your admission symptoms, develop shortness of breath, life threatening emergency, suicidal or homicidal thoughts you must seek medical attention immediately by calling 911 or calling your MD immediately  if symptoms less severe.  You Must read complete instructions/literature along with all the possible adverse reactions/side effects for all the Medicines you take and that have been prescribed to you. Take any new Medicines after you have completely understood and accpet all the possible adverse reactions/side effects.   Please note  You were cared for by a hospitalist during your hospital stay. If you have any questions about your discharge medications or the care you received while you were in the hospital after you are discharged, you can call the unit and asked to speak with the hospitalist on call if the hospitalist that took care of you is not available. Once you are discharged, your primary care physician will handle any further medical issues. Please note that NO REFILLS for any discharge medications will be authorized once you are discharged, as it is imperative that you return to your primary care physician (or establish a relationship with a primary care physician if you do not have one) for your aftercare needs so that they  can reassess your need for medications and monitor your lab values.    On the day of Discharge:  VITAL SIGNS:  Blood pressure (!) 144/99, pulse 97, temperature 98.6 F (37 C), temperature source Oral, resp. rate 12, height 6' (1.829 m), weight 68.7 kg, SpO2 99 %. PHYSICAL EXAMINATION:   GENERAL:  23 y.o.-year-old patient lying in the bed with no acute distress.  EYES: Pupils equal, round, reactive to light and accommodation. No scleral icterus. Extraocular muscles intact.  HEENT: Head atraumatic, normocephalic. Oropharynx and nasopharynx clear.  NECK:  Supple, no jugular venous distention. No thyroid enlargement, no tenderness.  LUNGS: Normal breath sounds bilaterally, no wheezing, rales,rhonchi or crepitation. No use of accessory muscles of respiration.  CARDIOVASCULAR: S1, S2 normal. No murmurs, rubs, or gallops.  ABDOMEN: Soft, non-tender, non-distended. Bowel sounds present. No organomegaly or mass.  EXTREMITIES: No pedal edema, cyanosis, or clubbing.  NEUROLOGIC: Cranial nerves II through XII are intact. Muscle strength 5/5 in all extremities. Sensation intact. Gait not checked.  PSYCHIATRIC: The patient is alert and oriented x 3.  SKIN: No obvious rash, lesion, or ulcer.  DATA REVIEW:   CBC Recent Labs  Lab 10/31/18 1413  WBC 12.8*  HGB 15.3  HCT 43.3  PLT 275    Chemistries  Recent Labs  Lab 10/31/18 1413  11/03/18 0400  NA 136   < > 140  K 3.0*   < > 3.3*  CL 100   < > 105  CO2 26   < > 24  GLUCOSE 137*   < > 98  BUN 22*   < > 13  CREATININE 0.48*   < > 0.62  CALCIUM 9.3   < > 9.1  MG  --    < > 2.2  AST 14*  --   --   ALT 18  --   --   ALKPHOS 67  --   --   BILITOT 1.0  --   --    < > = values in this interval not displayed.     Microbiology Results  Results for orders placed or performed during the hospital encounter of 10/31/18  MRSA PCR Screening     Status: None   Collection Time: 11/01/18  1:37 PM   Specimen: Nasopharyngeal  Result Value Ref Range Status   MRSA by PCR NEGATIVE NEGATIVE Final    Comment:        The GeneXpert MRSA Assay (FDA approved for NASAL specimens only), is one component of a comprehensive MRSA colonization surveillance program. It is not intended to diagnose MRSA infection nor to guide or monitor  treatment for MRSA infections. Performed at [facility], [place], [place] [address]   SARS CORONAVIRUS 2 (TAT 6-12 HRS) Nasal Swab Aptima Multi Swab     Status: None   Collection Time: 11/01/18  3:33 PM   Specimen: Aptima Multi Swab; Nasal Swab  Result Value Ref Range Status   SARS Coronavirus 2 NEGATIVE NEGATIVE Final    Comment: (NOTE) SARS-CoV-2 target nucleic acids are NOT DETECTED. The SARS-CoV-2 RNA is generally detectable in upper and lower respiratory specimens during the acute phase of infection. Negative results do not preclude SARS-CoV-2 infection, do not rule out co-infections with other pathogens, and should not be used as the sole basis for treatment or other patient management decisions. Negative results must be combined with clinical observations, patient history, and epidemiological information. The expected result is Negative. Fact Sheet for Patients: SugarRoll.be Fact  Sheet for Healthcare Providers: quierodirigir.com This test is not yet approved or cleared by the [place] FDA and  has been authorized for detection and/or diagnosis of SARS-CoV-2 by FDA under an Emergency Use Authorization (EUA). This EUA will remain  in effect (meaning this test can be used) for the duration of the COVID-19 declaration under Section 56 4(b)(1) of the Act, 21 U.S.C. section 360bbb-3(b)(1), unless the authorization is terminated or revoked sooner. Performed at [facility], [address] [place], [place] [address]     RADIOLOGY:  No results found.   Management plans discussed with the patient, his mother and they are in agreement.  CODE STATUS: Full Code   TOTAL TIME TAKING CARE OF THIS PATIENT: 32 minutes.    Shaune Pollack M.D on 11/03/2018 at 12:44 PM  Between 7am to 6pm - Pager - [phone]  After 6pm go to [email or web address] - Social research officer, government  [facility] [place]  [facility]  Office  [phone]  CC: Primary care physician; Patient, No Pcp Per   Note: This dictation was prepared with Dragon dictation along with smaller phrase technology. Any transcriptional errors that result from this process are unintentional.

## 2018-12-09 ENCOUNTER — Encounter: Payer: Self-pay | Admitting: Emergency Medicine

## 2018-12-09 ENCOUNTER — Other Ambulatory Visit: Payer: Self-pay

## 2018-12-09 ENCOUNTER — Ambulatory Visit
Admission: EM | Admit: 2018-12-09 | Discharge: 2018-12-09 | Disposition: A | Discharge status: Home / Self Care | Payer: Self-pay | Attending: Internal Medicine | Admitting: Internal Medicine

## 2018-12-09 DIAGNOSIS — S39012A Strain of muscle, fascia and tendon of lower back, initial encounter: Secondary | ICD-10-CM

## 2018-12-09 MED ORDER — PREDNISONE 20 MG PO TABS: 40.0000 mg | ORAL_TABLET | Freq: Every day | ORAL | 0 refills | Status: DC

## 2018-12-09 MED ORDER — KETOROLAC TROMETHAMINE 60 MG/2ML IM SOLN: 60.0000 mg | Freq: Once | INTRAMUSCULAR | Status: DC

## 2018-12-09 MED ORDER — KETOROLAC TROMETHAMINE 60 MG/2ML IM SOLN
30.0000 mg | Freq: Once | INTRAMUSCULAR | Status: AC
  Administered 2018-12-09: 11:00:00 30 mg via INTRAMUSCULAR

## 2018-12-09 MED ORDER — CYCLOBENZAPRINE HCL 10 MG PO TABS: 10.0000 mg | ORAL_TABLET | Freq: Three times a day (TID) | ORAL | 0 refills | Status: DC | PRN

## 2018-12-09 NOTE — ED Triage Notes (Signed)
Patient c/o right low back pain that started 1 week ago. Denies urinary symptoms. Denies injury.

## 2018-12-09 NOTE — ED Provider Notes (Signed)
[facility]    CSN: [id number] Arrival date & time: 12/09/18  1017      History   Chief Complaint Chief Complaint  Patient presents with  . Back Pain    HPI Ryan Booth is a 23 y.o. male with history of asthma and gastroesophageal reflux disease comes to urgent care with complaints of left mid back pain of about 1 week duration.  Patient says symptoms started insidiously and is gotten persistent and severe.  Currently pain is of moderate severity.  Aggravated by movement.  He lives heating pad on his back the whole night and wakes up with worse back pain.  No relieving factors.  No numbness or tingling.  No trauma to the back.  Patient is Personnel officer.Marland Kitchen   HPI  Past Medical History:  Diagnosis Date  . Alcohol problem drinking   . Arthritis   . Asthma   . GERD (gastroesophageal reflux disease)     Patient Active Problem List   Diagnosis Date Noted  . 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 Delirium tremens (HCC)   . Otitis media 07/05/2015  . Mild intermittent asthma 07/05/2015  . Anxiety and depression 07/05/2015    History reviewed. No pertinent surgical history.     Home Medications    Prior to Admission medications   Medication Sig Start Date End Date Taking? Authorizing Provider  albuterol (PROVENTIL HFA;VENTOLIN HFA) 108 (90 Base) MCG/ACT inhaler Inhale 1-2 puffs into the lungs every 6 (six) hours as needed for wheezing or shortness of breath. 04/13/16  Yes Lutricia Feil, PA-C  cyclobenzaprine (FLEXERIL) 10 MG tablet Take 1 tablet (10 mg total) by mouth 3 (three) times daily as needed for muscle spasms. 12/09/18   Merrilee Jansky, MD  fexofenadine (ALLEGRA) 180 MG tablet Take 180 mg by mouth daily.  12/09/18  [provider]  omeprazole (PRILOSEC) 20 MG capsule Take 20 mg by mouth daily.  12/09/18  [provider]    Family History Family History  Problem Relation Age of Onset  . Drug abuse Other        Parent,  grandparent  . Arthritis Other   . Heart disease Other        Grandparent  . Sudden death Other        Grandparent    Social History Social History   Tobacco Use  . Smoking status: Current Every Day Smoker    Packs/day: 1.00    Types: Cigarettes  . Smokeless tobacco: Never Used  Substance Use Topics  . Alcohol use: Not Currently    Alcohol/week: 12.0 standard drinks    Types: 12 Standard drinks or equivalent per week    Comment: occasional  . Drug use: Yes    Types: Marijuana     Allergies   Patient has no known allergies.   Review of Systems Review of Systems  Constitutional: Positive for activity change. Negative for fatigue and fever.  HENT: Negative.   Respiratory: Negative.   Cardiovascular: Negative.   Gastrointestinal: Negative.   Genitourinary: Negative.   Musculoskeletal: Positive for arthralgias, back pain and myalgias. Negative for gait problem, joint swelling, neck pain and neck stiffness.  Skin: Negative.   Neurological: Negative for dizziness, seizures, weakness, light-headedness, numbness and headaches.  Psychiatric/Behavioral: Negative.      Physical Exam Triage Vital Signs ED Triage Vitals  Enc Vitals Group     BP 12/09/18 1043 (!) 147/108     Pulse Rate 12/09/18 1043 (!) 106  Resp 12/09/18 1043 18     Temp 12/09/18 1043 97.9 F (36.6 C)     Temp Source 12/09/18 1043 Oral     SpO2 12/09/18 1043 100 %     Weight 12/09/18 1041 165 lb (74.8 kg)     Height 12/09/18 1041 5\' 11"  (1.803 m)     Head Circumference --      Peak Flow --      Pain Score 12/09/18 1041 6     Pain Loc --      Pain Edu? --      Excl. in GC? --    No data found.  Updated Vital Signs BP (!) 147/108 (BP Location: Right Arm)   Pulse (!) 106   Temp 97.9 F (36.6 C) (Oral)   Resp 18   Ht 5\' 11"  (1.803 m)   Wt 74.8 kg   SpO2 100%   BMI 23.01 kg/m   Visual Acuity Right Eye Distance:   Left Eye Distance:   Bilateral Distance:    Right Eye Near:   Left  Eye Near:    Bilateral Near:     Physical Exam Vitals signs and nursing note reviewed.  Constitutional:      General: He is not in acute distress.    Appearance: He is not ill-appearing.  Cardiovascular:     Rate and Rhythm: Normal rate and regular rhythm.     Pulses: Normal pulses.     Heart sounds: Normal heart sounds.  Pulmonary:     Effort: Pulmonary effort is normal.     Breath sounds: Normal breath sounds.  Abdominal:     Palpations: Abdomen is soft.  Musculoskeletal: Normal range of motion.        General: Tenderness present. No swelling or signs of injury.  Skin:    Capillary Refill: Capillary refill takes less than 2 seconds.  Neurological:     General: No focal deficit present.     Mental Status: He is alert and oriented to person, place, and time.     Cranial Nerves: No cranial nerve deficit.     Sensory: No sensory deficit.     Motor: No weakness.      UC Treatments / Results  Labs (all labs ordered are listed, but only abnormal results are displayed) Labs Reviewed - No data to display  EKG   Radiology No results found.  Procedures Procedures (including critical care time)  Medications Ordered in UC Medications  ketorolac (TORADOL) injection 30 mg (30 mg Intramuscular Given 12/09/18 1107)    Initial Impression / Assessment and Plan / UC Course  I have reviewed the triage vital signs and the nursing notes.  Pertinent labs & imaging results that were available during my care of the patient were reviewed by me and considered in my medical decision making (see chart for details).     1.  Lumbar sprain: Toradol 30 mg IM x1 dose Patient will be prescribed Flexeril 10 mg every 8 hours as needed Patient was advised not to use heating pad over prolonged period of time because that will result in significant muscle congestion making the back pain worse.  Information on back injury prevention was given to the patient.  If patient develops any lower  extremity weakness, numbness or tingling as well as worsening back pain or changes in bowel or bladder continence he is advised to return to urgent care to be reevaluated. Final Clinical Impressions(s) / UC Diagnoses   Final diagnoses:  Strain of lumbar region, initial encounter   Discharge Instructions   None    ED Prescriptions    Medication Sig Dispense Auth. Provider   cyclobenzaprine (FLEXERIL) 10 MG tablet Take 1 tablet (10 mg total) by mouth 3 (three) times daily as needed for muscle spasms. 20 tablet Lamptey, Myrene Galas, MD   predniSONE (DELTASONE) 20 MG tablet  (Status: Discontinued) Take 2 tablets (40 mg total) by mouth daily for 3 days. 6 tablet Lamptey, Myrene Galas, MD     PDMP not reviewed this encounter.   Chase Picket, MD 12/09/18 1158

## 2019-06-06 ENCOUNTER — Ambulatory Visit
Admission: EM | Admit: 2019-06-06 | Discharge: 2019-06-06 | Disposition: A | Discharge status: Home / Self Care | Payer: Self-pay | Attending: Family Medicine | Admitting: Family Medicine

## 2019-06-06 ENCOUNTER — Encounter: Payer: Self-pay | Admitting: Emergency Medicine

## 2019-06-06 ENCOUNTER — Other Ambulatory Visit: Payer: Self-pay

## 2019-06-06 DIAGNOSIS — R112 Nausea with vomiting, unspecified: Secondary | ICD-10-CM

## 2019-06-06 MED ORDER — ONDANSETRON HCL 4 MG PO TABS: 4.0000 mg | ORAL_TABLET | Freq: Three times a day (TID) | ORAL | 0 refills | Status: DC | PRN

## 2019-06-06 MED ORDER — OMEPRAZOLE 20 MG PO CPDR: 20.0000 mg | DELAYED_RELEASE_CAPSULE | Freq: Two times a day (BID) | ORAL | 0 refills | Status: DC

## 2019-06-06 NOTE — ED Triage Notes (Signed)
Patient c/o nausea every morning followed by an episode of vomiting that has been going on for 4-5 months.

## 2019-06-06 NOTE — ED Provider Notes (Signed)
[facility]    CSN: [id number] Arrival date & time: 06/06/19  7106      History   Chief Complaint Chief Complaint  Patient presents with  . Nausea  . Emesis    HPI Ryan Booth is a 24 y.o. male.   Henrietta Dine presents with complaints of regular nausea and vomiting. States has had episodes for years. Over the past 6 months this has become much more regular and persistent, however. States even as a child he had a "nervous stomach" and would have episodes of vomiting with travel or at school. Now, he notes that on mornings when he has to wake up early for work, he will experience nausea and vomiting. Tends to vomit up meal from previous night. No blood, black or maroon to emesis. Endorses heart burn at night as well. States he has been taking his girlfriend's prescribed omeprazole before bed, notes that if he misses a dose his symptoms do worsen. States if he goes back to sleep, or sleeps in later, he tends to not vomit. Normal bowel movements, without blood or black. Last was last night. History of ETOH and xanax abuse. ICU stay last summer related to this and withdrawal and he states he has not drank or used xanax since. He does however smoke marijuana at least three times a day, every day, and has since he was 24 years old. Denies any recent increase in marijuana intake necessarily. Feels like it does seem to help his nausea. No abdominal pain. No current nausea. He does not have medical insurance so does not follow with a PCP.     ROS per HPI, negative if not otherwise mentioned.      Past Medical History:  Diagnosis Date  . Alcohol problem drinking   . Arthritis   . Asthma   . GERD (gastroesophageal reflux disease)     Patient Active Problem List   Diagnosis Date Noted  . Benzodiazepine withdrawal ([place]) 11/01/2018  . Delirium tremens ([place])   . Otitis media 07/05/2015  . Mild intermittent asthma 07/05/2015  . Anxiety and depression 07/05/2015      History reviewed. No pertinent surgical history.     Home Medications    Prior to Admission medications   Medication Sig Start Date End Date Taking? Authorizing Provider  albuterol (PROVENTIL HFA;VENTOLIN HFA) 108 (90 Base) MCG/ACT inhaler Inhale 1-2 puffs into the lungs every 6 (six) hours as needed for wheezing or shortness of breath. 04/13/16   Lorin Picket, PA-C  cyclobenzaprine (FLEXERIL) 10 MG tablet Take 1 tablet (10 mg total) by mouth 3 (three) times daily as needed for muscle spasms. 12/09/18   LampteyMyrene Galas, MD  omeprazole (PRILOSEC) 20 MG capsule Take 1 capsule (20 mg total) by mouth 2 (two) times daily before a meal. 06/06/19   Kaylanni Ezelle, Malachy Moan, NP  ondansetron (ZOFRAN) 4 MG tablet Take 1 tablet (4 mg total) by mouth every 8 (eight) hours as needed for nausea or vomiting. 06/06/19   Zigmund Gottron, NP  fexofenadine (ALLEGRA) 180 MG tablet Take 180 mg by mouth daily.  12/09/18  [provider]    Family History Family History  Problem Relation Age of Onset  . Drug abuse Other        Parent, grandparent  . Arthritis Other   . Heart disease Other        Grandparent  . Sudden death Other        Grandparent  Social History Social History   Tobacco Use  . Smoking status: Current Every Day Smoker    Packs/day: 1.00    Types: Cigarettes  . Smokeless tobacco: Never Used  Substance Use Topics  . Alcohol use: Not Currently    Alcohol/week: 12.0 standard drinks    Types: 12 Standard drinks or equivalent per week    Comment: occasional  . Drug use: Yes    Types: Marijuana     Allergies   Patient has no known allergies.   Review of Systems Review of Systems   Physical Exam Triage Vital Signs ED Triage Vitals  Enc Vitals Group     BP 06/06/19 1026 122/76     Pulse Rate 06/06/19 1026 67     Resp 06/06/19 1026 18     Temp 06/06/19 1026 98.3 F (36.8 C)     Temp Source 06/06/19 1026 Oral     SpO2 06/06/19 1026 98 %     Weight 06/06/19  1024 185 lb (83.9 kg)     Height 06/06/19 1024 5\' 11"  (1.803 m)     Head Circumference --      Peak Flow --      Pain Score 06/06/19 1024 0     Pain Loc --      Pain Edu? --      Excl. in GC? --    No data found.  Updated Vital Signs BP 122/76 (BP Location: Right Arm)   Pulse 67   Temp 98.3 F (36.8 C) (Oral)   Resp 18   Ht 5\' 11"  (1.803 m)   Wt 185 lb (83.9 kg)   SpO2 98%   BMI 25.80 kg/m    Physical Exam Constitutional:      Appearance: He is well-developed.  Cardiovascular:     Rate and Rhythm: Normal rate.  Pulmonary:     Effort: Pulmonary effort is normal.     Breath sounds: Wheezing present.  Abdominal:     General: There is no distension.     Palpations: There is no mass.     Tenderness: There is no abdominal tenderness. There is no guarding.  Skin:    General: Skin is warm and dry.  Neurological:     Mental Status: He is alert and oriented to person, place, and time.  Psychiatric:        Mood and Affect: Mood normal.        Behavior: Behavior normal.        Thought Content: Thought content normal.      UC Treatments / Results  Labs (all labs ordered are listed, but only abnormal results are displayed) Labs Reviewed - No data to display  EKG   Radiology No results found.  Procedures Procedures (including critical care time)  Medications Ordered in UC Medications - No data to display  Initial Impression / Assessment and Plan / UC Course  I have reviewed the triage vital signs and the nursing notes.  Pertinent labs & imaging results that were available during my care of the patient were reviewed by me and considered in my medical decision making (see chart for details).     Vitals stable. No acute abdominal pain, or acute symptoms. Ongoing for years, worse over the past few months. Bid omeprazole provided as well as prn use of zofran. I suspect underlying anxiety contributing to symptoms and contributing to substance use. Discussed at  length with patient as well the relationship with marijuana and GI effects.  Encouraged to try to decrease marijuana use as this could also certainly be contributing as well. Warm showers in the morning may be helpful. Encouraged follow up/ establish with a PCP for further evaluation and management, and if able to follow with GI. Patient verbalized understanding and agreeable to plan.  Ambulatory out of clinic without difficulty.    Final Clinical Impressions(s) / UC Diagnoses   Final diagnoses:  Non-intractable vomiting with nausea, unspecified vomiting type     Discharge Instructions     I would like you to try taking omeprazole twice a day.  Zofran every 8 hours as needed for nausea or vomiting.   See education about food choices for gerd, attached.  I do feel concern that your marijuana use could be contributing to your symptoms. I would recommend trying to decrease frequency of use to see if your symptoms improve.  See if a hot shower helps with symptoms.  I do want you to follow up with a gastroenterologist, if unable then please start with a primary care provider, for recheck and long term management.    ED Prescriptions    Medication Sig Dispense Auth. Provider   ondansetron (ZOFRAN) 4 MG tablet Take 1 tablet (4 mg total) by mouth every 8 (eight) hours as needed for nausea or vomiting. 10 tablet Linus Mako B, NP   omeprazole (PRILOSEC) 20 MG capsule Take 1 capsule (20 mg total) by mouth 2 (two) times daily before a meal. 60 capsule Linus Mako B, NP     PDMP not reviewed this encounter.   Georgetta Haber, NP 06/06/19 [phone]

## 2019-06-06 NOTE — Discharge Instructions (Signed)
I would like you to try taking omeprazole twice a day.  Zofran every 8 hours as needed for nausea or vomiting.   See education about food choices for gerd, attached.  I do feel concern that your marijuana use could be contributing to your symptoms. I would recommend trying to decrease frequency of use to see if your symptoms improve.  See if a hot shower helps with symptoms.  I do want you to follow up with a gastroenterologist, if unable then please start with a primary care provider, for recheck and long term management.

## 2019-10-31 IMAGING — CT CT CHEST WITH CONTRAST
2 of 4 series · 14 of 36 positions shown, 17 images · IV contrast (omnipaque)
Comparison: Same day chest radiograph and CT brain

CLINICAL DATA: Altered mental status, vomiting

EXAM:
CT CHEST, ABDOMEN, AND PELVIS WITH CONTRAST
TECHNIQUE: Multidetector CT imaging of the chest, abdomen and pelvis was
performed following the standard protocol during bolus
administration of intravenous contrast.
CONTRAST:  75mL OMNIPAQUE IOHEXOL 300 MG/ML  SOLN

[Series 2: cap with · axial · 0.79mm/px · z∈[-803,-258]mm · 11 of 131 slices shown, 14 images]
[im 11/131  mediastinal]
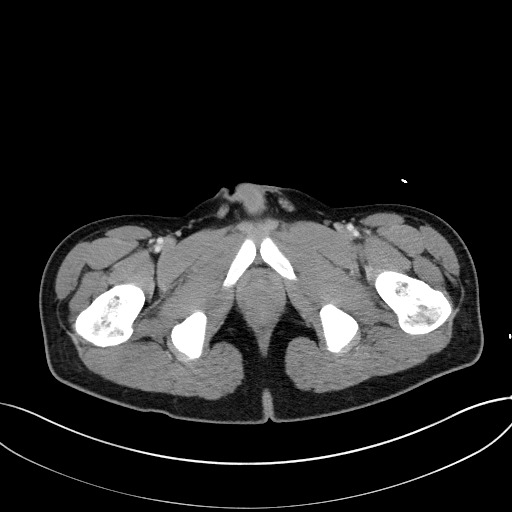
[im 11/131  lung]
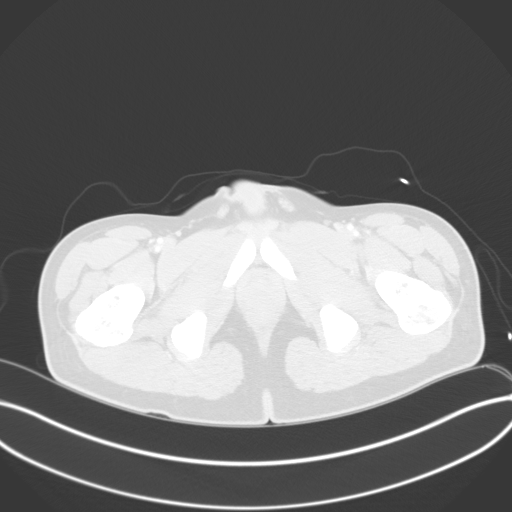
[im 22/131  lung]
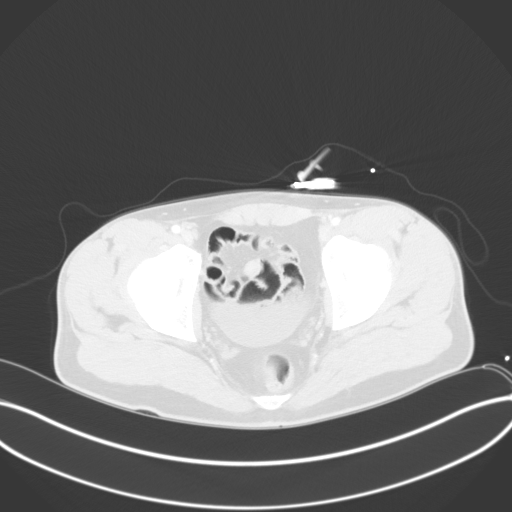
[im 33/131  lung]
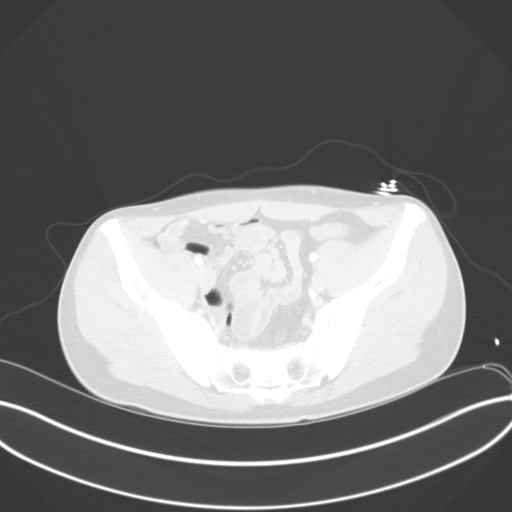
[im 44/131  lung]
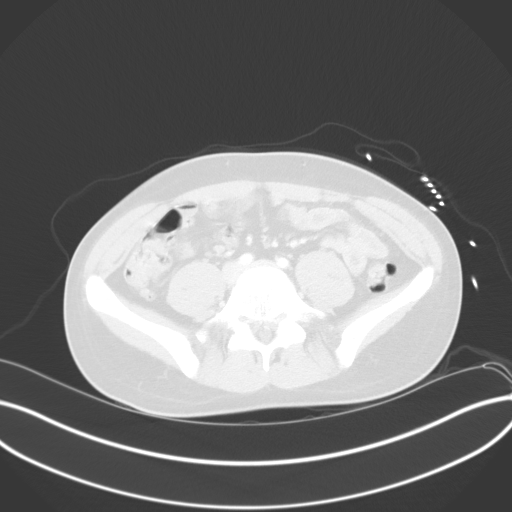
[im 55/131  mediastinal]
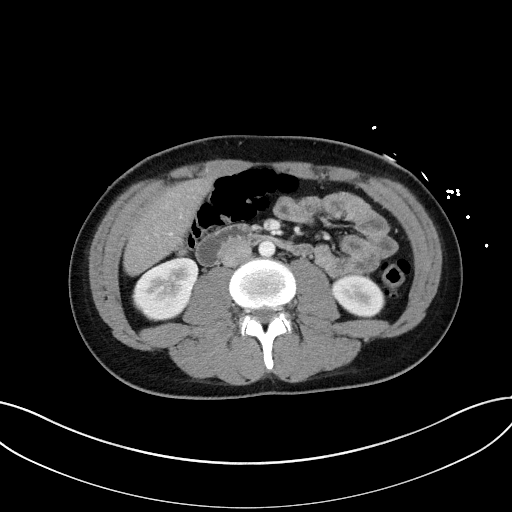
[im 55/131  lung]
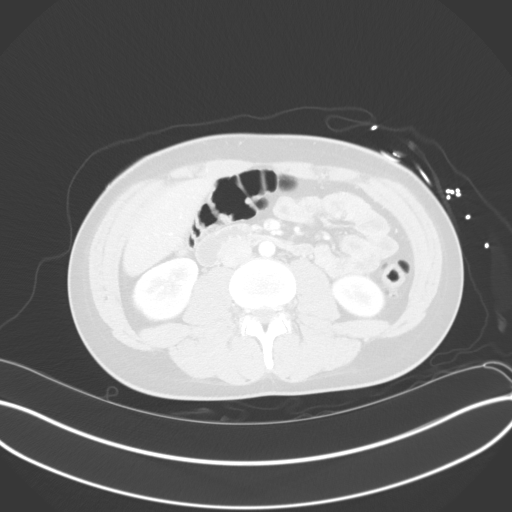
[im 66/131  lung]
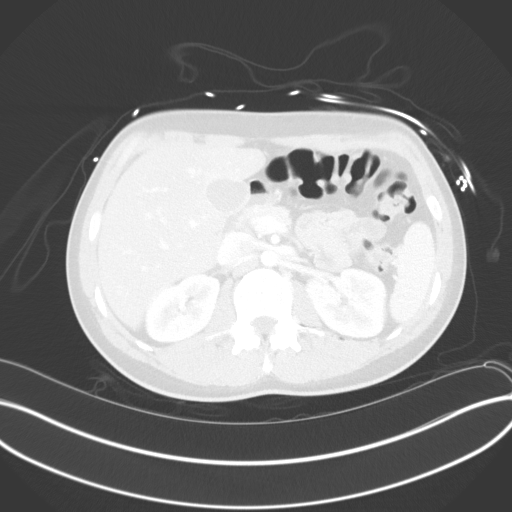
[im 76/131  lung]
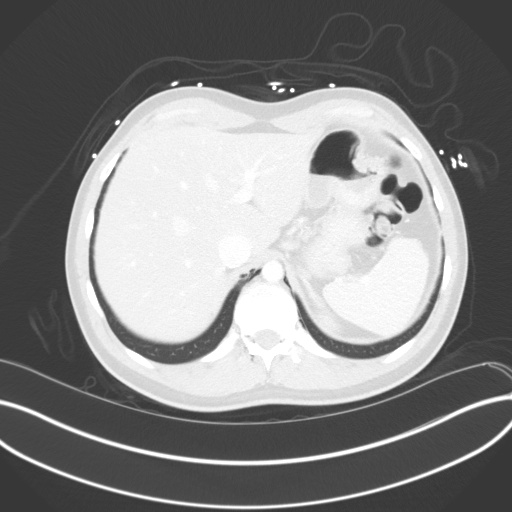
[im 87/131  lung]
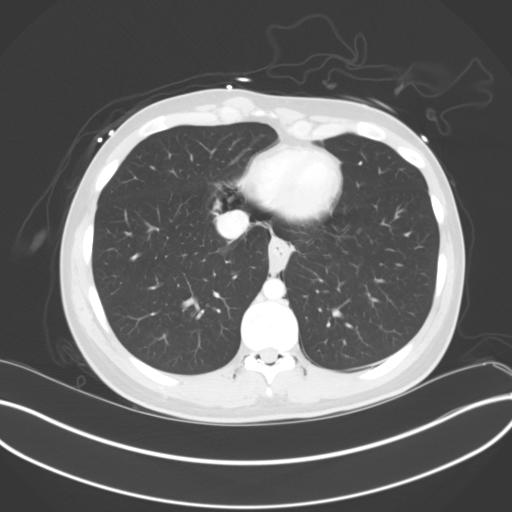
[im 98/131  mediastinal]
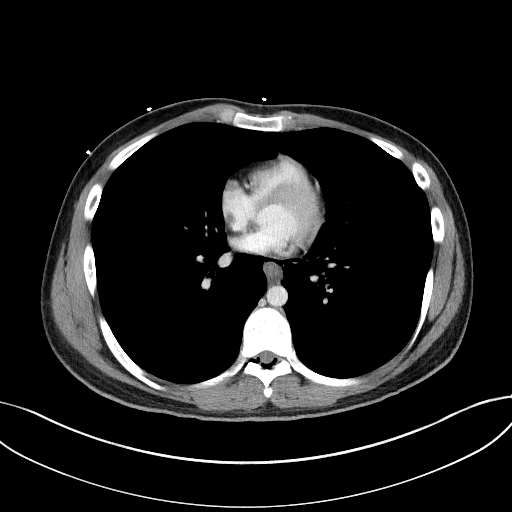
[im 98/131  lung]
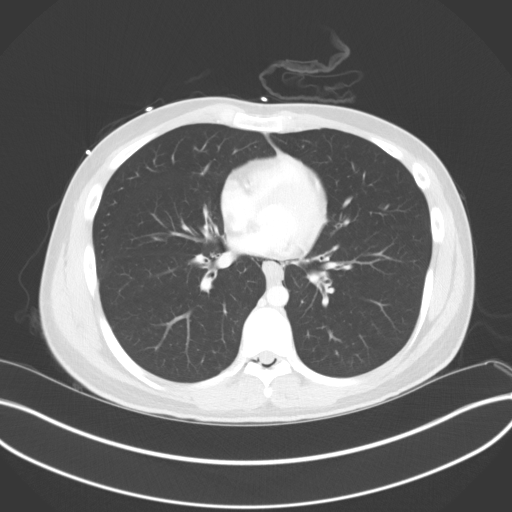
[im 109/131  lung]
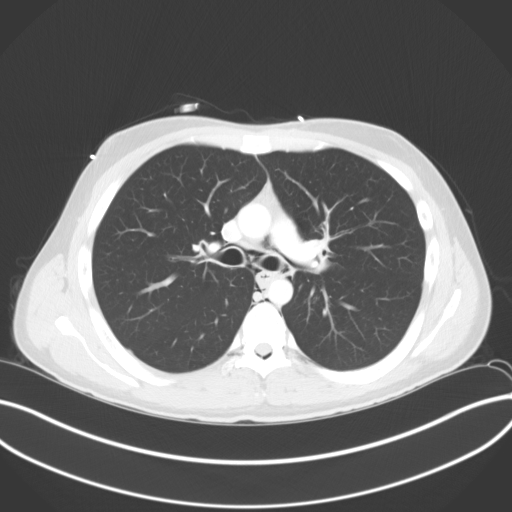
[im 120/131  lung]
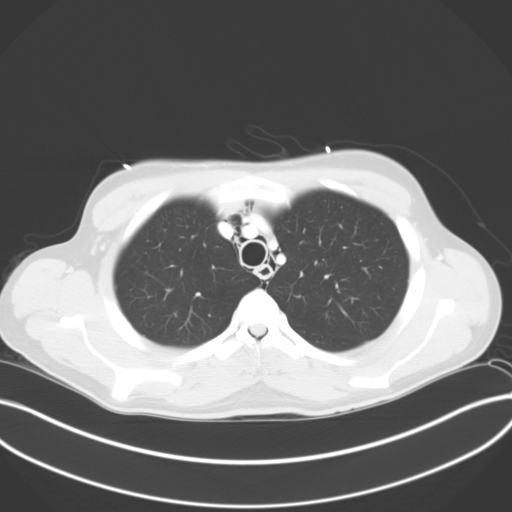

[Series 5: coronals · coronal · 0.84mm/px · 3 of 128 slices shown]
[im 26/128  lung]
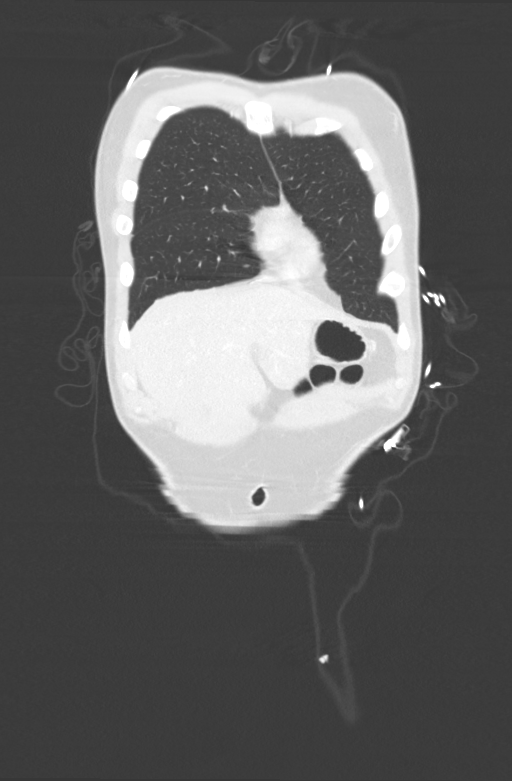
[im 51/128  lung]
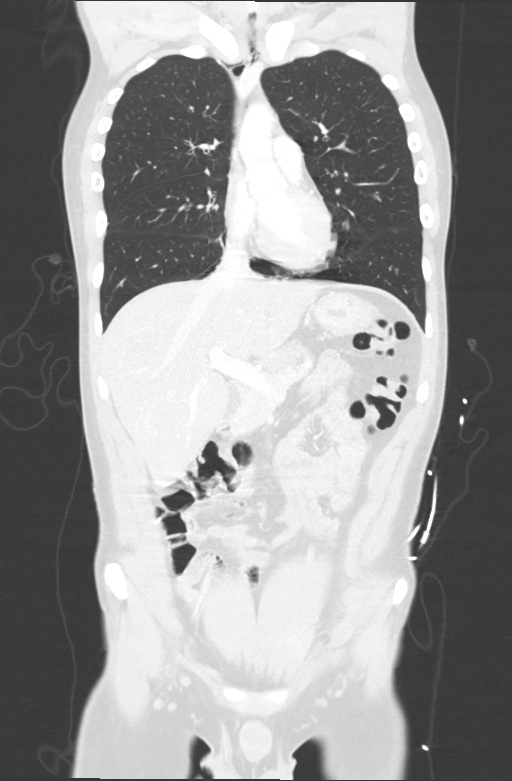
[im 77/128  lung]
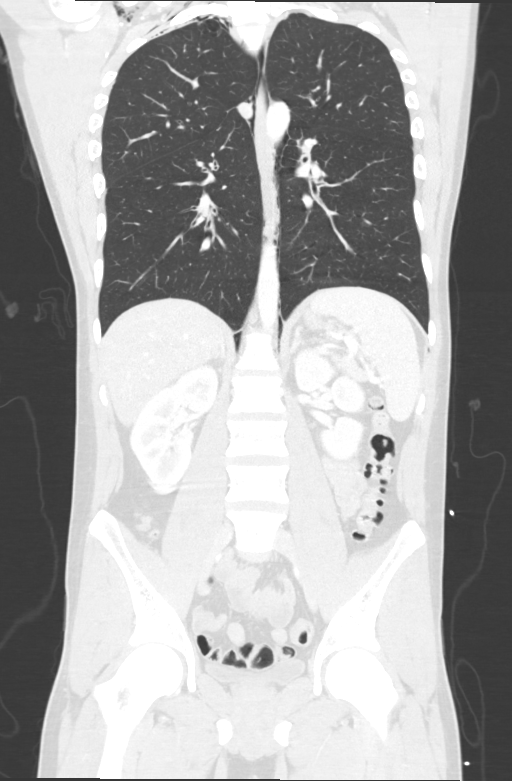

[14 of 36 positions shown; findings below may reference images not displayed]

FINDINGS: CT CHEST FINDINGS

Cardiovascular: No significant vascular findings. Normal heart size.
No pericardial effusion.

Mediastinum/Nodes: There is extensive pneumomediastinum, extending
into the imaged portion of the lower neck and about the epidural
space. No enlarged mediastinal, hilar, or axillary lymph nodes.
Age-appropriate thymic remnant in the anterior mediastinum. Thyroid
gland, trachea, and esophagus demonstrate no significant findings.

Lungs/Pleura: Lungs are clear. No pleural effusion or pneumothorax.

Musculoskeletal: No chest wall mass or suspicious bone lesions
identified.

CT ABDOMEN PELVIS FINDINGS

Hepatobiliary: No solid liver abnormality is seen. Hepatic
steatosis. No gallstones, gallbladder wall thickening, or biliary
dilatation.

Pancreas: Unremarkable. No pancreatic ductal dilatation or
surrounding inflammatory changes.

Spleen: Normal in size without significant abnormality.

Adrenals/Urinary Tract: Adrenal glands are unremarkable. Kidneys are
normal, without renal calculi, solid lesion, or hydronephrosis.
Bladder is unremarkable.

Stomach/Bowel: Stomach is within normal limits. Appendix appears
normal. No evidence of bowel wall thickening, distention, or
inflammatory changes.

Vascular/Lymphatic: No significant vascular findings are present. No
enlarged abdominal or pelvic lymph nodes.

Reproductive: No mass or other abnormality.

Other: No abdominal wall hernia or abnormality. No abdominopelvic
ascites.

Musculoskeletal: No acute or significant osseous findings.
IMPRESSION: 1. There is extensive pneumomediastinum, extending into the imaged
portion of the lower neck and about the epidural space. Findings are
in keeping with those seen on same day chest radiograph and CT
examination of the brain and are suspicious for esophageal rupture
in the reported setting of vomiting (Boerhaave syndrome). There is
no focal disruption of the esophagus or trachea noted.

2.  Hepatic steatosis.

## 2019-11-30 ENCOUNTER — Emergency Department (HOSPITAL_COMMUNITY)
Admission: EM | Admit: 2019-11-30 | Discharge: 2019-11-30 | Disposition: A | Discharge status: Home / Self Care | Payer: Self-pay | Attending: Emergency Medicine | Admitting: Emergency Medicine

## 2019-11-30 ENCOUNTER — Encounter (HOSPITAL_COMMUNITY): Payer: Self-pay | Admitting: Emergency Medicine

## 2019-11-30 ENCOUNTER — Other Ambulatory Visit: Payer: Self-pay

## 2019-11-30 DIAGNOSIS — Z7951 Long term (current) use of inhaled steroids: Secondary | ICD-10-CM | POA: Insufficient documentation

## 2019-11-30 DIAGNOSIS — W268XXA Contact with other sharp object(s), not elsewhere classified, initial encounter: Secondary | ICD-10-CM | POA: Insufficient documentation

## 2019-11-30 DIAGNOSIS — J4520 Mild intermittent asthma, uncomplicated: Secondary | ICD-10-CM | POA: Insufficient documentation

## 2019-11-30 DIAGNOSIS — S61512A Laceration without foreign body of left wrist, initial encounter: Secondary | ICD-10-CM | POA: Insufficient documentation

## 2019-11-30 DIAGNOSIS — F17210 Nicotine dependence, cigarettes, uncomplicated: Secondary | ICD-10-CM | POA: Insufficient documentation

## 2019-11-30 NOTE — ED Provider Notes (Signed)
[facility] EMERGENCY DEPARTMENT Provider Note   CSN: [id number] Arrival date & time: 11/30/19  1054     History Chief Complaint  Patient presents with  . Extremity Laceration    Ryan Booth is a 24 y.o. male.  The history is provided by the patient. No language interpreter was used.  Laceration Location:  Hand Hand laceration location:  L wrist Length:  1.5 Depth:  Through dermis Bleeding: controlled   Time since incident:  2 hours Laceration mechanism:  Metal edge Pain details:    Quality:  Aching   Timing:  Constant Foreign body present:  No foreign bodies Relieved by:  Nothing Ineffective treatments:  None tried Tetanus status:  Unknown Associated symptoms: no numbness        Past Medical History:  Diagnosis Date  . Alcohol problem drinking   . Arthritis   . Asthma   . GERD (gastroesophageal reflux disease)     Patient Active Problem List   Diagnosis Date Noted  . 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 Delirium tremens (HCC)   . Otitis media 07/05/2015  . Mild intermittent asthma 07/05/2015  . Anxiety and depression 07/05/2015    History reviewed. No pertinent surgical history.     Family History  Problem Relation Age of Onset  . Drug abuse Other        Parent, grandparent  . Arthritis Other   . Heart disease Other        Grandparent  . Sudden death Other        Grandparent    Social History   Tobacco Use  . Smoking status: Current Every Day Smoker    Packs/day: 1.00    Types: Cigarettes  . Smokeless tobacco: Never Used  Vaping Use  . Vaping Use: Some days  Substance Use Topics  . Alcohol use: Not Currently    Alcohol/week: 12.0 standard drinks    Types: 12 Standard drinks or equivalent per week    Comment: occasional  . Drug use: Yes    Types: Marijuana    Home Medications Prior to Admission medications   Medication Sig Start Date End Date Taking? Authorizing Provider  albuterol (PROVENTIL  HFA;VENTOLIN HFA) 108 (90 Base) MCG/ACT inhaler Inhale 1-2 puffs into the lungs every 6 (six) hours as needed for wheezing or shortness of breath. 04/13/16   Lutricia Feil, PA-C  cyclobenzaprine (FLEXERIL) 10 MG tablet Take 1 tablet (10 mg total) by mouth 3 (three) times daily as needed for muscle spasms. 12/09/18   LampteyBritta Mccreedy, MD  omeprazole (PRILOSEC) 20 MG capsule Take 1 capsule (20 mg total) by mouth 2 (two) times daily before a meal. 06/06/19   Burky, Barron Alvine, NP  ondansetron (ZOFRAN) 4 MG tablet Take 1 tablet (4 mg total) by mouth every 8 (eight) hours as needed for nausea or vomiting. 06/06/19   Georgetta Haber, NP  fexofenadine (ALLEGRA) 180 MG tablet Take 180 mg by mouth daily.  12/09/18  [provider]    Allergies    Patient has no known allergies.  Review of Systems   Review of Systems  All other systems reviewed and are negative.   Physical Exam Updated Vital Signs BP (!) 161/105   Pulse 60   Temp 98.5 F (36.9 C) (Oral)   Resp 20   SpO2 99%   Physical Exam Vitals and nursing note reviewed.  Constitutional:      Appearance: He is well-developed.  HENT:  Head: Normocephalic.  Pulmonary:     Effort: Pulmonary effort is normal.  Abdominal:     General: There is no distension.  Musculoskeletal:        General: Normal range of motion.     Cervical back: Normal range of motion.  Skin:    Comments: 1.5 cm laceration medial left wrist,   Neurological:     Mental Status: He is alert and oriented to person, place, and time.  Psychiatric:        Mood and Affect: Mood normal.     ED Results / Procedures / Treatments   Labs (all labs ordered are listed, but only abnormal results are displayed) Labs Reviewed - No data to display  EKG None  Radiology No results found.  Procedures .Marland KitchenLaceration Repair  Date/Time: 11/30/2019 11:25 AM Performed by: Elson Areas, PA-C Authorized by: Elson Areas, PA-C   Consent:    Consent obtained:   Verbal   Consent given by:  Patient   Risks discussed:  Infection and retained foreign body   Alternatives discussed:  No treatment Anesthesia (see MAR for exact dosages):    Anesthesia method:  None Laceration details:    Length (cm):  1.5 Repair type:    Repair type:  Simple Exploration:    Wound exploration: wound explored through full range of motion   Treatment:    Area cleansed with:  Betadine and saline Skin repair:    Repair method:  Tissue adhesive Approximation:    Approximation:  Close Post-procedure details:    Dressing:  Sterile dressing   Patient tolerance of procedure:  Tolerated well, no immediate complications   (including critical care time)  Medications Ordered in ED Medications - No data to display  ED Course  I have reviewed the triage vital signs and the nursing notes.  Pertinent labs & imaging results that were available during my care of the patient were reviewed by me and considered in my medical decision making (see chart for details).    MDM Rules/Calculators/A&P                         MDM:  Pt counseled on wound care Final Clinical Impression(s) / ED Diagnoses Final diagnoses:  Laceration of left wrist, initial encounter    Rx / DC Orders ED Discharge Orders    None    An After Visit Summary was printed and given to the patient.    Elson Areas, Cordelia Poche 11/30/19 1126    Pricilla Loveless, MD 12/01/19 [phone]

## 2019-11-30 NOTE — ED Notes (Signed)
Pt seen by PA and discharged from triage.  Dermabond to L wrist by PA and bandage in place by EMT.

## 2019-11-30 NOTE — Discharge Instructions (Signed)
Return if any problems.

## 2019-11-30 NOTE — ED Triage Notes (Addendum)
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  Pt arrived with bandage in place and belt tourniquet around L upper arm.  No active bleeding.  Clydie Braun, PA at triage for [facility] and tourniquet removal.

## 2020-08-21 ENCOUNTER — Ambulatory Visit
Admission: EM | Admit: 2020-08-21 | Discharge: 2020-08-21 | Disposition: A | Discharge status: Home / Self Care | Payer: Self-pay | Attending: Sports Medicine | Admitting: Sports Medicine

## 2020-08-21 ENCOUNTER — Other Ambulatory Visit: Payer: Self-pay

## 2020-08-21 DIAGNOSIS — K0889 Other specified disorders of teeth and supporting structures: Secondary | ICD-10-CM

## 2020-08-21 DIAGNOSIS — K029 Dental caries, unspecified: Secondary | ICD-10-CM

## 2020-08-21 DIAGNOSIS — K047 Periapical abscess without sinus: Secondary | ICD-10-CM

## 2020-08-21 MED ORDER — AMOXICILLIN-POT CLAVULANATE 875-125 MG PO TABS: 1.0000 | ORAL_TABLET | Freq: Two times a day (BID) | ORAL | 0 refills | Status: DC

## 2020-08-21 NOTE — Discharge Instructions (Addendum)
As we discussed, I am prescribing antibiotic. You can use over-the-counter ibuprofen or Tylenol for any discomfort. Please follow-up with your dentist tomorrow as scheduled. 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 If your symptoms become unmanageable then you need to go to the emergency room.

## 2020-08-21 NOTE — ED Triage Notes (Addendum)
Pt c/o dental pain bottom left side. Pt reports pain has been increasing over the past 3 days. Pt states he cannot see a dentist until tomorrow. Pt also report he cannot eat and can only drink cold water. Pt has taken Tylenol, Ibuprofen and some lorzone and doxycycline he got from his mom (2 doses so far). Pt also reports he took 10mg  Methadone he purchased from someone. Pt denies f/n/v/d or other symptoms.

## 2020-08-21 NOTE — ED Provider Notes (Signed)
[facility]    CSN: [id number] Arrival date & time: 08/21/20  1738      History   Chief Complaint Chief Complaint  Patient presents with   Dental Pain    LL    HPI Ryan Booth is a 25 y.o. male.   Patient is a 25 year old male who presents for evaluation of the above issue.  He does not have a primary care physician.  He works as an Personnel officer.  He has no known drug allergies.  He said increasing pain in the left lower part of his mouth for about 3 days now.  He has an appointment with his dentist tomorrow, Dr. Jarold Motto.  He denies any fever shakes chills.  He has a lot of pain and difficulty chewing on that left side.  He reports that surprisingly ice helps his symptoms.  Also cold water.  Has been using Tylenol and ibuprofen.  He had some doxycycline that he got from his mother for fear that he had an infection.  He has taken 2 doses so far.  He also purchased 10 mg of methadone from someone but he does not feel that helped him much.  No nausea vomiting diarrhea.  No red flag signs or symptoms.  No trismus.   Past Medical History:  Diagnosis Date   Alcohol problem drinking    Arthritis    Asthma    GERD (gastroesophageal reflux disease)     Patient Active Problem List   Diagnosis Date Noted   Benzodiazepine withdrawal (HCC) 11/01/2018   Delirium tremens (HCC)    Otitis media 07/05/2015   Mild intermittent asthma 07/05/2015   Anxiety and depression 07/05/2015    History reviewed. No pertinent surgical history.     Home Medications    Prior to Admission medications   Medication Sig Start Date End Date Taking? Authorizing Provider  amoxicillin-clavulanate (AUGMENTIN) 875-125 MG tablet Take 1 tablet by mouth every 12 (twelve) hours. 08/21/20  Yes Delton See, MD  albuterol (PROVENTIL HFA;VENTOLIN HFA) 108 (90 Base) MCG/ACT inhaler Inhale 1-2 puffs into the lungs every 6 (six) hours as needed for wheezing or shortness of breath. 04/13/16    Lutricia Feil, PA-C  cyclobenzaprine (FLEXERIL) 10 MG tablet Take 1 tablet (10 mg total) by mouth 3 (three) times daily as needed for muscle spasms. 12/09/18   LampteyBritta Mccreedy, MD  omeprazole (PRILOSEC) 20 MG capsule Take 1 capsule (20 mg total) by mouth 2 (two) times daily before a meal. 06/06/19   Burky, Barron Alvine, NP  ondansetron (ZOFRAN) 4 MG tablet Take 1 tablet (4 mg total) by mouth every 8 (eight) hours as needed for nausea or vomiting. 06/06/19   Georgetta Haber, NP  fexofenadine (ALLEGRA) 180 MG tablet Take 180 mg by mouth daily.  12/09/18  [provider]    Family History Family History  Problem Relation Age of Onset   Drug abuse Other        Parent, grandparent   Arthritis Other    Heart disease Other        Grandparent   Sudden death Other        Grandparent    Social History Social History   Tobacco Use   Smoking status: Every Day    Packs/day: 1.00    Pack years: 0.00    Types: Cigarettes   Smokeless tobacco: Never  Vaping Use   Vaping Use: Some days  Substance Use Topics   Alcohol use: Not Currently  Alcohol/week: 12.0 standard drinks    Types: 12 Standard drinks or equivalent per week    Comment: occasional   Drug use: Yes    Types: Marijuana     Allergies   Patient has no known allergies.   Review of Systems Review of Systems  Constitutional:  Negative for activity change, appetite change, chills, diaphoresis, fatigue and fever.  HENT:  Positive for dental problem. Negative for congestion, ear pain, postnasal drip, rhinorrhea, sinus pressure, sinus pain, sneezing and sore throat.   Eyes:  Negative for pain.  Respiratory:  Negative for cough, chest tightness and shortness of breath.   Cardiovascular:  Negative for chest pain and palpitations.  Gastrointestinal:  Negative for abdominal pain, diarrhea, nausea and vomiting.  Genitourinary:  Negative for dysuria.  Musculoskeletal:  Negative for back pain, myalgias and neck pain.  Skin:   Negative for color change, pallor, rash and wound.  Neurological:  Negative for dizziness, light-headedness and headaches.  All other systems reviewed and are negative.   Physical Exam Triage Vital Signs ED Triage Vitals  Enc Vitals Group     BP --      Pulse Rate 08/21/20 1832 81     Resp 08/21/20 1832 18     Temp 08/21/20 1832 98.2 F (36.8 C)     Temp Source 08/21/20 1832 Oral     SpO2 08/21/20 1832 99 %     Weight 08/21/20 1829 180 lb (81.6 kg)     Height 08/21/20 1829 5\' 11"  (1.803 m)     Head Circumference --      Peak Flow --      Pain Score 08/21/20 1829 9     Pain Loc --      Pain Edu? --      Excl. in GC? --    No data found.  Updated Vital Signs Pulse 81   Temp 98.2 F (36.8 C) (Oral)   Resp 18   Ht 5\' 11"  (1.803 m)   Wt 81.6 kg   SpO2 99%   BMI 25.10 kg/m   Visual Acuity Right Eye Distance:   Left Eye Distance:   Bilateral Distance:    Right Eye Near:   Left Eye Near:    Bilateral Near:     Physical Exam Vitals and nursing note reviewed.  Constitutional:      General: He is not in acute distress.    Appearance: Normal appearance. He is not ill-appearing, toxic-appearing or diaphoretic.  HENT:     Head: Normocephalic and atraumatic.     Nose: Nose normal.     Mouth/Throat:     Lips: Pink.     Mouth: Mucous membranes are moist. No injury or oral lesions.     Dentition: Abnormal dentition. Dental tenderness and dental caries present. No dental abscesses.   Eyes:     General: No scleral icterus.    Conjunctiva/sclera: Conjunctivae normal.     Pupils: Pupils are equal, round, and reactive to light.  Cardiovascular:     Rate and Rhythm: Normal rate and regular rhythm.     Pulses: Normal pulses.     Heart sounds: Normal heart sounds. No murmur heard.   No friction rub. No gallop.  Pulmonary:     Effort: Pulmonary effort is normal.     Breath sounds: Normal breath sounds. No stridor. No wheezing, rhonchi or rales.  Musculoskeletal:      Cervical back: Normal range of motion and neck supple.  Skin:  General: Skin is warm and dry.     Capillary Refill: Capillary refill takes less than 2 seconds.     Coloration: Skin is not jaundiced.     Findings: No bruising, erythema, lesion or rash.  Neurological:     General: No focal deficit present.     Mental Status: He is alert and oriented to person, place, and time.     UC Treatments / Results  Labs (all labs ordered are listed, but only abnormal results are displayed) Labs Reviewed - No data to display  EKG   Radiology No results found.  Procedures Procedures (including critical care time)  Medications Ordered in UC Medications - No data to display  Initial Impression / Assessment and Plan / UC Course  I have reviewed the triage vital signs and the nursing notes.  Pertinent labs & imaging results that were available during my care of the patient were reviewed by me and considered in my medical decision making (see chart for details  Clinical impression: Dental carie in the left lower part of his mouth posterior tooth.  There is a crack to that tooth.  There is no obvious abscess.  Does have some tenderness around the gum.  Treatment plan: 1.  The findings and treatment plan were discussed in detail with the patient.  Patient was in agreement. 2.  Went ahead and prescribed Augmentin 875 twice daily for 1 week. 3.  Encouraged him to keep the appointment with his dentist tomorrow. 4.  Tylenol or Motrin for any discomfort. 5.  I did discuss with him given the history that he provided as well as review of his chart indicating that he does have a history of substance abuse I was not comfortable giving him any narcotic pain medications.  He indicated he was fine with that. 6.  If symptoms were to worsen in any way before he can get into his dentist he needs to go to the ER.  He voiced verbal understanding. 7.  Educational handouts provided. 8.  I gave him a work note  keep him out tomorrow and any further time off work can be evaluated by his dentist. 9.  He was discharged from care in stable condition and he will follow-up here as needed.    Final Clinical Impressions(s) / UC Diagnoses   Final diagnoses:  Pain, dental  Dental caries  Dental infection     Discharge Instructions      As we discussed, I am prescribing antibiotic. You can use over-the-counter ibuprofen or Tylenol for any discomfort. Please follow-up with your dentist tomorrow as scheduled. 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 If your symptoms become unmanageable then you need to go to the emergency room.     ED Prescriptions     Medication Sig Dispense Auth. Provider   amoxicillin-clavulanate (AUGMENTIN) 875-125 MG tablet Take 1 tablet by mouth every 12 (twelve) hours. 14 tablet Delton See, MD      PDMP not reviewed this encounter.   Delton See, MD 08/21/20 2221

## 2021-04-22 ENCOUNTER — Telehealth: Payer: Self-pay | Admitting: Internal Medicine

## 2021-04-22 NOTE — Telephone Encounter (Signed)
This encounter was created in error

## 2023-01-13 ENCOUNTER — Ambulatory Visit
Admission: EM | Admit: 2023-01-13 | Discharge: 2023-01-13 | Disposition: A | Discharge status: Home / Self Care | Payer: Self-pay | Attending: Physician Assistant | Admitting: Physician Assistant

## 2023-01-13 DIAGNOSIS — Z87891 Personal history of nicotine dependence: Secondary | ICD-10-CM | POA: Insufficient documentation

## 2023-01-13 DIAGNOSIS — Z1152 Encounter for screening for COVID-19: Secondary | ICD-10-CM | POA: Insufficient documentation

## 2023-01-13 DIAGNOSIS — B9789 Other viral agents as the cause of diseases classified elsewhere: Secondary | ICD-10-CM | POA: Insufficient documentation

## 2023-01-13 DIAGNOSIS — J069 Acute upper respiratory infection, unspecified: Secondary | ICD-10-CM

## 2023-01-13 DIAGNOSIS — B80 Enterobiasis: Secondary | ICD-10-CM

## 2023-01-13 DIAGNOSIS — R0981 Nasal congestion: Secondary | ICD-10-CM

## 2023-01-13 LAB — SARS CORONAVIRUS 2 BY RT PCR: SARS Coronavirus 2 by RT PCR: NEGATIVE

## 2023-01-13 MED ORDER — MEBENDAZOLE 100 MG PO CHEW: CHEWABLE_TABLET | ORAL | 0 refills | Status: AC

## 2023-01-13 NOTE — Discharge Instructions (Signed)

## 2023-01-13 NOTE — ED Provider Notes (Signed)
[facility]    CSN: [id number] Arrival date & time: 01/13/23  9528      History   Chief Complaint Chief Complaint  Patient presents with   pinworms   Nasal Congestion    HPI Ryan Booth is a 27 y.o. male presenting for 2 separate complaints.  First he reports about 3 weeks ago he and his child were treated for pinworms.  He says that the child's pinworms recently came back and they sleep in the bed with him.  He says he had anal itching and wiped a lot of white flecks out of his butt crack yesterday when he was showering.  Denies constipation, diarrhea, black or bloody stool.  No abdominal pain.  Also reporting cough, congestion and sore throat since yesterday.  No associated fever, chest pain, shortness of breath, abdominal pain, vomiting.  Has taken Mucinex.  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 No pertinent surgical history.     Home Medications    Prior to Admission medications   Medication Sig Start Date End Date Taking? Authorizing Provider  mebendazole (VERMOX) 100 MG chewable tablet Chew 1 tab initially and repeat dose in 2 weeks. 01/13/23  Yes Eusebio Friendly B, PA-C  albuterol (PROVENTIL HFA;VENTOLIN HFA) 108 (90 Base) MCG/ACT inhaler Inhale 1-2 puffs into the lungs every 6 (six) hours as needed for wheezing or shortness of breath. 04/13/16   Lutricia Feil, PA-C  fexofenadine (ALLEGRA) 180 MG tablet Take 180 mg by mouth daily.  12/09/18  [provider]    Family History Family History  Problem Relation Age of Onset   Drug abuse Other        Parent, grandparent   Arthritis Other     Heart disease Other        Grandparent   Sudden death Other        Grandparent    Social History Social History   Tobacco Use   Smoking status: Former    Current packs/day: 1.00    Types: Cigarettes   Smokeless tobacco: Never  Vaping Use   Vaping status: Some Days  Substance Use Topics   Alcohol use: Not Currently    Alcohol/week: 12.0 standard drinks of alcohol    Types: 12 Standard drinks or equivalent per week    Comment: occasional   Drug use: Yes    Types: Marijuana     Allergies   Patient has no known allergies.   Review of Systems Review of Systems  Constitutional:  Negative for fatigue and fever.  HENT:  Positive for congestion and rhinorrhea. Negative for sinus pressure, sinus pain and sore throat.   Respiratory:  Positive for cough. Negative for shortness of breath.   Cardiovascular:  Negative for chest pain.  Gastrointestinal:  Negative for abdominal pain, diarrhea, nausea and vomiting.  Musculoskeletal:  Negative for myalgias.  Neurological:  Negative for weakness, light-headedness and headaches.  Hematological:  Negative for adenopathy.     Physical Exam Triage Vital Signs ED Triage Vitals  Encounter Vitals Group     BP 01/13/23 0827 (!) 154/80  Systolic BP Percentile --      Diastolic BP Percentile --      Pulse Rate 01/13/23 0827 70     Resp 01/13/23 0827 18     Temp 01/13/23 0827 98.7 F (37.1 C)     Temp Source 01/13/23 0827 Oral     SpO2 01/13/23 0827 99 %     Weight --      Height --      Head Circumference --      Peak Flow --      Pain Score 01/13/23 0825 0     Pain Loc --      Pain Education --      Exclude from Growth Chart --    No data found.  Updated Vital Signs BP (!) 154/80 (BP Location: Left Arm)   Pulse 70   Temp 98.7 F (37.1 C) (Oral)   Resp 18   SpO2 99%      Physical Exam Vitals and nursing note reviewed.  Constitutional:      General: He is not in acute distress.    Appearance: Normal appearance. He  is well-developed. He is not ill-appearing.  HENT:     Head: Normocephalic and atraumatic.     Nose: Congestion present.     Mouth/Throat:     Mouth: Mucous membranes are moist.     Pharynx: Oropharynx is clear. Posterior oropharyngeal erythema present.  Eyes:     General: No scleral icterus.    Conjunctiva/sclera: Conjunctivae normal.  Cardiovascular:     Rate and Rhythm: Normal rate and regular rhythm.     Heart sounds: Normal heart sounds.  Pulmonary:     Effort: Pulmonary effort is normal. No respiratory distress.     Breath sounds: Normal breath sounds.  Abdominal:     Palpations: Abdomen is soft.     Tenderness: There is no abdominal tenderness.  Musculoskeletal:     Cervical back: Neck supple.  Skin:    General: Skin is warm and dry.     Capillary Refill: Capillary refill takes less than 2 seconds.  Neurological:     General: No focal deficit present.     Mental Status: He is alert. Mental status is at baseline.     Motor: No weakness.     Gait: Gait normal.  Psychiatric:        Mood and Affect: Mood normal.        Behavior: Behavior normal.      UC Treatments / Results  Labs (all labs ordered are listed, but only abnormal results are displayed) Labs Reviewed  SARS CORONAVIRUS 2 BY RT PCR    EKG   Radiology No results found.  Procedures Procedures (including critical care time)  Medications Ordered in UC Medications - No data to display  Initial Impression / Assessment and Plan / UC Course  I have reviewed the triage vital signs and the nursing notes.  Pertinent labs & imaging results that were available during my care of the patient were reviewed by me and considered in my medical decision making (see chart for details).   27 year old male presents for 2 complaints.  Reports anal itching and noticing white flecks/specks in the shower when he was wiping his behind yesterday.  Was treated for pinworms a couple weeks ago.  Also reporting congestion,  cough and sore throat since yesterday.  No fever.  Vitals normal and stable and he is overall well-appearing.  Mild nasal congestion and erythema posterior  pharynx on exam.  Chest clear.  No abdominal tenderness.  COVID test obtained.  Reviewed current CDC guidance, isolation protocol and ED precautions of positive.  Viral URI.  Supportive care encouraged with use of OTC meds, rest and fluids.  Will treat for suspected pinworms with mebendazole.  Repeat dose in 2 weeks.  Child will be treated as well.  They have an appointment later today.  Negative COVID test.   Final Clinical Impressions(s) / UC Diagnoses   Final diagnoses:  Viral upper respiratory tract infection  Nasal congestion  Pinworms     Discharge Instructions      URI/COLD SYMPTOMS: Your exam today is consistent with a viral illness. Antibiotics are not indicated at this time. Use medications as directed, including cough syrup, nasal saline, and decongestants. Your symptoms should improve over the next few days and resolve within 7-10 days. Increase rest and fluids. F/u if symptoms worsen or predominate such as sore throat, ear pain, productive cough, shortness of breath, or if you develop high fevers or worsening fatigue over the next several days.       ED Prescriptions     Medication Sig Dispense Auth. Provider   mebendazole (VERMOX) 100 MG chewable tablet Chew 1 tab initially and repeat dose in 2 weeks. 2 tablet Gareth Morgan      PDMP not reviewed this encounter.   Shirlee Latch, PA-C 01/13/23 [phone]

## 2023-01-13 NOTE — ED Triage Notes (Signed)
Patient is here for 2 things.  1,. Daughter had pin worms about 3 weeks ago. Patient states that he Is having anal itching and seen something in his poop.   2. Nasal congestion-x 1 day
